# Patient Record
Sex: Female | Born: 1995 | Race: Black or African American | Hispanic: No | Marital: Single | Smoking: Never smoker
Health system: Southern US, Community
[De-identification: ages and names within clinical notes are randomized; demographics above are authoritative.]

## PROBLEM LIST (undated history)

## (undated) DIAGNOSIS — S060XAA Concussion with loss of consciousness status unknown, initial encounter: Secondary | ICD-10-CM

## (undated) DIAGNOSIS — N83209 Unspecified ovarian cyst, unspecified side: Secondary | ICD-10-CM

---

## 2017-05-26 ENCOUNTER — Ambulatory Visit (INDEPENDENT_AMBULATORY_CARE_PROVIDER_SITE_OTHER): Payer: Federal, State, Local not specified - PPO

## 2017-05-26 ENCOUNTER — Encounter: Payer: Self-pay | Admitting: Urgent Care

## 2017-05-26 ENCOUNTER — Ambulatory Visit (INDEPENDENT_AMBULATORY_CARE_PROVIDER_SITE_OTHER): Payer: Federal, State, Local not specified - PPO | Admitting: Urgent Care

## 2017-05-26 VITALS — BP 125/76 | HR 70 | Temp 98.1°F | Resp 16 | Ht 65.0 in | Wt 165.0 lb

## 2017-05-26 DIAGNOSIS — R002 Palpitations: Secondary | ICD-10-CM | POA: Diagnosis not present

## 2017-05-26 DIAGNOSIS — Z8659 Personal history of other mental and behavioral disorders: Secondary | ICD-10-CM | POA: Diagnosis not present

## 2017-05-26 DIAGNOSIS — R0789 Other chest pain: Secondary | ICD-10-CM

## 2017-05-26 NOTE — Progress Notes (Signed)
  MRN: [id number] DOB: 04/11/1995  Subjective:   Susan Barry is a 22 y.o. female presenting for 1 day history of constant, sharp, non-radiating mid-left upper sided chest pain. Has also had palpitations which have resolved. Denies fever, trauma, shob, heart racing, diaphoresis, n/v, abdominal pain, limb pain, cough. Has a history of panic attacks, patient reports that this is different. Was thought to have thyroid disease when she was in high school. Denies smoking cigarettes or drinking alcohol. Denies drug use. 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 [place]. Has good relationships at home, has a good support network. Denies depressed or anxious mood, has minimal stressors.   Susan Barry has a current medication list which includes the following prescription(s): levonorgestrel-ethinyl estradiol. Also has No Known Allergies.  Susan Barry denies past medical and surgical history. Denies family history of MI, sudden cardiac death, arrhythmias.  Objective:   Vitals: BP 125/76   Pulse 70   Temp 98.1 F (36.7 C)   Resp 16   Ht 5\' 5"  (1.651 m)   Wt 165 lb (74.8 kg)   SpO2 100%   BMI 27.46 kg/m   Physical Exam  Constitutional: She is oriented to person, place, and time. She appears well-developed and well-nourished.  HENT:  Mouth/Throat: Oropharynx is clear and moist.  Eyes: EOM are normal. Pupils are equal, round, and reactive to light. No scleral icterus.  Neck: Normal range of motion. Neck supple. No thyromegaly present.  Cardiovascular: Normal rate, regular rhythm and intact distal pulses. Exam reveals no gallop and no friction rub.  No murmur heard. Pulmonary/Chest: No respiratory distress. She has no wheezes. She has no rales.  Abdominal: Soft. Bowel sounds are normal. She exhibits no distension and no mass. There is no tenderness.  Musculoskeletal: She exhibits no edema.  Neurological: She is alert and oriented to person,  place, and time.  Skin: Skin is warm and dry. No rash noted. No erythema. No pallor.  Psychiatric: She has a normal mood and affect.   ECG interpretation - 1 PAC noted. No acute findings. Normal sinus rhythm at 75bpm. No previous ecg for comparison.  Assessment and Plan :   Atypical chest pain - Plan: EKG 12-Lead, DG Chest 2 View, Basic metabolic panel, Thyroid Panel With TSH  Palpitations  History of panic attacks  Will manage conservatively with APAP + ibuprofen for musculoskeletal type pain. Hydrate well. Labs pending. Consider referral to cardiology. If labs equivocal, consider H. Pylori testing for atypical presentation. Return-to-clinic precautions discussed, patient verbalized understanding. Radiology report pending.  Wallis BambergMario Asusena Sigley, PA-C [facility] at [facility] [phone] 05/26/2017  12:13 PM

## 2017-05-26 NOTE — Patient Instructions (Signed)
Hydrate well with at least 2 liters (1 gallon) of water daily. You may take 500mg  Tylenol with ibuprofen 400-600mg  every 6 hours for pain and inflammation.    Nonspecific Chest Pain Chest pain can be caused by many different conditions. There is a chance that your pain could be related to something serious, such as a heart attack or a blood clot in your lungs. Chest pain can also be caused by conditions that are not life-threatening. If you have chest pain, it is very important to follow up with your doctor. Follow these instructions at home: Medicines  If you were prescribed an antibiotic medicine, take it as told by your doctor. Do not stop taking the antibiotic even if you start to feel better.  Take over-the-counter and prescription medicines only as told by your doctor. Lifestyle  Do not use any products that contain nicotine or tobacco, such as cigarettes and e-cigarettes. If you need help quitting, ask your doctor.  Do not drink alcohol.  Make lifestyle changes as told by your doctor. These may include: ? Getting regular exercise. Ask your doctor for some activities that are safe for you. ? Eating a heart-healthy diet. A diet specialist (dietitian) can help you to learn healthy eating options. ? Staying at a healthy weight. ? Managing diabetes, if needed. ? Lowering your stress, as with deep breathing or spending time in nature. General instructions  Avoid any activities that make you feel chest pain.  If your chest pain is because of heartburn: ? Raise (elevate) the head of your bed about 6 inches (15 cm). You can do this by putting blocks under the bed legs at the head of the bed. ? Do not sleep with extra pillows under your head. That does not help heartburn.  Keep all follow-up visits as told by your doctor. This is important. This includes any further testing if your chest pain does not go away. Contact a doctor if:  Your chest pain does not go away.  You have a rash  with blisters on your chest.  You have a fever.  You have chills. Get help right away if:  Your chest pain is worse.  You have a cough that gets worse, or you cough up blood.  You have very bad (severe) pain in your belly (abdomen).  You are very weak.  You pass out (faint).  You have either of these for no clear reason: ? Sudden chest discomfort. ? Sudden discomfort in your arms, back, neck, or jaw.  You have shortness of breath at any time.  You suddenly start to sweat, or your skin gets clammy.  You feel sick to your stomach (nauseous).  You throw up (vomit).  You suddenly feel light-headed or dizzy.  Your heart starts to beat fast, or it feels like it is skipping beats. These symptoms may be an emergency. Do not wait to see if the symptoms will go away. Get medical help right away. Call your local emergency services (911 in the [place] Do not drive yourself to the hospital. This information is not intended to replace advice given to you by your health care provider. Make sure you discuss any questions you have with your health care provider. Document Released: 09/10/2007 Document Revised: 12/17/2015 Document Reviewed: 12/17/2015 Elsevier Interactive Patient Education  2017 [employer]

## 2017-05-27 ENCOUNTER — Encounter: Payer: Self-pay | Admitting: Radiology

## 2017-05-27 LAB — BASIC METABOLIC PANEL
BUN / CREAT RATIO: 15 (ref 9–23)
BUN: 13 mg/dL (ref 6–20)
CO2: 24 mmol/L (ref 20–29)
Calcium: 9.6 mg/dL (ref 8.7–10.2)
Chloride: 102 mmol/L (ref 96–106)
Creatinine, Ser: 0.86 mg/dL (ref 0.57–1.00)
GFR calc Af Amer: 111 mL/min/{1.73_m2} (ref >59)
GFR, EST NON AFRICAN AMERICAN: 96 mL/min/{1.73_m2} (ref >59)
Glucose: 74 mg/dL (ref 65–99)
Potassium: 4.5 mmol/L (ref 3.5–5.2)
SODIUM: 139 mmol/L (ref 134–144)

## 2017-05-27 LAB — THYROID PANEL WITH TSH
Free Thyroxine Index: 2.3 (ref 1.2–4.9)
T3 Uptake Ratio: 26 % (ref 24–39)
T4, Total: 9 ug/dL (ref 4.5–12.0)
TSH: 1.04 u[IU]/mL (ref 0.450–4.500)

## 2017-05-28 ENCOUNTER — Ambulatory Visit: Payer: Federal, State, Local not specified - PPO | Admitting: Urgent Care

## 2017-06-02 ENCOUNTER — Encounter: Payer: Self-pay | Admitting: Urgent Care

## 2018-03-05 DIAGNOSIS — G43909 Migraine, unspecified, not intractable, without status migrainosus: Secondary | ICD-10-CM | POA: Insufficient documentation

## 2019-11-16 ENCOUNTER — Encounter (HOSPITAL_COMMUNITY): Payer: Self-pay

## 2019-11-16 ENCOUNTER — Ambulatory Visit (HOSPITAL_COMMUNITY)
Admission: RE | Admit: 2019-11-16 | Discharge: 2019-11-16 | Disposition: A | Discharge status: Home / Self Care | Payer: Federal, State, Local not specified - PPO | Source: Ambulatory Visit | Attending: Urgent Care | Admitting: Urgent Care

## 2019-11-16 ENCOUNTER — Other Ambulatory Visit: Payer: Self-pay

## 2019-11-16 VITALS — BP 132/82 | HR 84 | Temp 99.5°F | Resp 16

## 2019-11-16 DIAGNOSIS — K529 Noninfective gastroenteritis and colitis, unspecified: Secondary | ICD-10-CM

## 2019-11-16 DIAGNOSIS — R197 Diarrhea, unspecified: Secondary | ICD-10-CM | POA: Diagnosis not present

## 2019-11-16 HISTORY — DX: Unspecified ovarian cyst, unspecified side: N83.209

## 2019-11-16 MED ORDER — LOPERAMIDE HCL 2 MG PO CAPS: 2.0000 mg | ORAL_CAPSULE | Freq: Two times a day (BID) | ORAL | 0 refills | Status: DC | PRN

## 2019-11-16 NOTE — ED Triage Notes (Addendum)
Patient is here today with complaints of RLQ abdominal pain  And diarrhea that began last Thursday. Patient states she is having 3-5 watery diarrhea episodes  Daily. Patient states she has not tried any OTC anti-diarrhea medications for relief. Patient states she does have a Hx Ovarian Cyst as well.

## 2019-11-16 NOTE — Discharge Instructions (Addendum)

## 2019-11-16 NOTE — ED Provider Notes (Signed)
[facility]   MRN: [id number] DOB: 1995-10-10  Subjective:   Susan Barry is a 24 y.o. female presenting for 5-day history of persistent watery diarrhea, mild intermittent right lower quadrant pain.  Has had 3-5 bowel movements per day.  Patient states that she almost exclusively eats out for every single one of her meals.  Denies fever, nausea, vomiting, bloody stools, recent antibiotic use, recent hospitalizations or travel.  Denies sore throat, cough, chest pain, shortness of breath, loss of sense of taste and smell.  Has not tried medications for relief.  Denies history of GI issues.  No current facility-administered medications for this encounter.  Current Outpatient Medications:  .  levonorgestrel-ethinyl estradiol (LEVORA 0.15/30, 28,) 0.15-30 MG-MCG tablet, TK 1 T PO QD, Disp: , Rfl:    No Known Allergies  Past Medical History:  Diagnosis Date  . Ovarian cyst      History reviewed. No pertinent surgical history.  History reviewed. No pertinent family history.  Social History   Tobacco Use  . Smoking status: Never Smoker  . Smokeless tobacco: Never Used  Vaping Use  . Vaping Use: Never used  Substance Use Topics  . Alcohol use: Yes    Comment: occ  . Drug use: Never    ROS   Objective:   Vitals: BP 132/82 (BP Location: Right Arm)   Pulse 84   Temp 99.5 F (37.5 C) (Oral)   Resp 16   LMP 10/27/2019 (Exact Date)   SpO2 99%   Physical Exam Constitutional:      General: She is not in acute distress.    Appearance: Normal appearance. She is well-developed. She is not ill-appearing, toxic-appearing or diaphoretic.  HENT:     Head: Normocephalic and atraumatic.     Nose: Nose normal.     Mouth/Throat:     Mouth: Mucous membranes are moist.     Pharynx: Oropharynx is clear.  Eyes:     General: No scleral icterus.       Right eye: No discharge.        Left eye: No discharge.     Extraocular Movements: Extraocular movements intact.      Conjunctiva/sclera: Conjunctivae normal.     Pupils: Pupils are equal, round, and reactive to light.  Cardiovascular:     Rate and Rhythm: Normal rate.  Pulmonary:     Effort: Pulmonary effort is normal.  Abdominal:     General: Bowel sounds are normal. There is no distension.     Palpations: Abdomen is soft. There is no mass.     Tenderness: There is no abdominal tenderness. There is no right CVA tenderness, left CVA tenderness, guarding or rebound.  Skin:    General: Skin is warm and dry.  Neurological:     General: No focal deficit present.     Mental Status: She is alert and oriented to person, place, and time.  Psychiatric:        Mood and Affect: Mood normal.        Behavior: Behavior normal.        Thought Content: Thought content normal.        Judgment: Judgment normal.       Assessment and Plan :   PDMP not reviewed this encounter.  1. Diarrhea, unspecified type   2. Colitis     Recommended significant dietary modifications.  Patient was agreeable to holding off on GI panel.  Use loperamide for supportive care. Counseled patient on potential for  adverse effects with medications prescribed/recommended today, ER and return-to-clinic precautions discussed, patient verbalized understanding.    Wallis Bamberg, PA-C 11/16/19 1752

## 2020-12-06 ENCOUNTER — Emergency Department (HOSPITAL_COMMUNITY): Payer: Managed Care, Other (non HMO)

## 2020-12-06 ENCOUNTER — Encounter (HOSPITAL_COMMUNITY): Payer: Self-pay | Admitting: Emergency Medicine

## 2020-12-06 ENCOUNTER — Other Ambulatory Visit: Payer: Self-pay

## 2020-12-06 ENCOUNTER — Emergency Department (HOSPITAL_COMMUNITY)
Admission: EM | Admit: 2020-12-06 | Discharge: 2020-12-06 | Disposition: A | Discharge status: Home / Self Care | Payer: Managed Care, Other (non HMO) | Attending: Emergency Medicine | Admitting: Emergency Medicine

## 2020-12-06 DIAGNOSIS — R000 Tachycardia, unspecified: Secondary | ICD-10-CM | POA: Insufficient documentation

## 2020-12-06 DIAGNOSIS — N9489 Other specified conditions associated with female genital organs and menstrual cycle: Secondary | ICD-10-CM | POA: Insufficient documentation

## 2020-12-06 DIAGNOSIS — R002 Palpitations: Secondary | ICD-10-CM | POA: Insufficient documentation

## 2020-12-06 LAB — BASIC METABOLIC PANEL
Anion gap: 7 (ref 5–15)
BUN: 15 mg/dL (ref 6–20)
CO2: 24 mmol/L (ref 22–32)
Calcium: 9.5 mg/dL (ref 8.9–10.3)
Chloride: 105 mmol/L (ref 98–111)
Creatinine, Ser: 0.89 mg/dL (ref 0.44–1.00)
GFR, Estimated: >60 mL/min (ref >60)
Glucose, Bld: 96 mg/dL (ref 70–99)
Potassium: 3.9 mmol/L (ref 3.5–5.1)
Sodium: 136 mmol/L (ref 135–145)

## 2020-12-06 LAB — CBC
HCT: 37.4 % (ref 36.0–46.0)
Hemoglobin: 12.6 g/dL (ref 12.0–15.0)
MCH: 30.1 pg (ref 26.0–34.0)
MCHC: 33.7 g/dL (ref 30.0–36.0)
MCV: 89.3 fL (ref 80.0–100.0)
Platelets: 332 10*3/uL (ref 150–400)
RBC: 4.19 MIL/uL (ref 3.87–5.11)
RDW: 12 % (ref 11.5–15.5)
WBC: 9.8 10*3/uL (ref 4.0–10.5)
nRBC: 0 % (ref 0.0–0.2)

## 2020-12-06 LAB — D-DIMER, QUANTITATIVE: D-Dimer, Quant: 0.65 ug/mL-FEU — ABNORMAL HIGH (ref 0.00–0.50)

## 2020-12-06 LAB — TROPONIN I (HIGH SENSITIVITY)
Troponin I (High Sensitivity): 3 ng/L (ref <18)
Troponin I (High Sensitivity): 3 ng/L (ref <18)

## 2020-12-06 LAB — I-STAT BETA HCG BLOOD, ED (MC, WL, AP ONLY): I-stat hCG, quantitative: <5 m[IU]/mL (ref <5)

## 2020-12-06 MED ORDER — IOHEXOL 350 MG/ML SOLN
80.0000 mL | Freq: Once | INTRAVENOUS | Status: AC | PRN
  Administered 2020-12-06: 80 mL via INTRAVENOUS

## 2020-12-06 NOTE — ED Provider Notes (Signed)
EKG reviewed. Sinus tachycardia rate 109. Normal intervals, normal axis, normal ST-T waves   Linwood Dibbles, MD 12/06/20 1814

## 2020-12-06 NOTE — ED Triage Notes (Signed)
Pt states that she has been taking muscle relaxers for sciatica pain and stopped taking it today. States that she started having irregular heartrate today at work. Alert and oriented.

## 2020-12-06 NOTE — ED Provider Notes (Signed)
[facility]-EMERGENCY DEPT Provider Note   CSN: [id number] Arrival date & time: 12/06/20  1735     History Chief Complaint  Patient presents with   Irregular Heart Beat    Susan Barry is a 25 y.o. female.  HPI   Pt started having episodes of her heart fluctuating today.  At times it was very fast and then seemed slow.  No chest pain or shortness of breath.   No leg swelling.  No fevers or chills.  No syncope.  Pt is being treated for sciatica.  She did not take her meds today.  Past Medical History:  Diagnosis Date   Ovarian cyst     There are no problems to display for this patient.   History reviewed. No pertinent surgical history.   OB History   No obstetric history on file.     History reviewed. No pertinent family history.  Social History   Tobacco Use   Smoking status: Never   Smokeless tobacco: Never  Vaping Use   Vaping Use: Never used  Substance Use Topics   Alcohol use: Yes    Comment: occ   Drug use: Never    Home Medications Prior to Admission medications   Medication Sig Start Date End Date Taking? Authorizing Provider  levonorgestrel-ethinyl estradiol (LEVORA 0.15/30, 28,) 0.15-30 MG-MCG tablet TK 1 T PO QD 12/05/14   [provider]  loperamide (IMODIUM) 2 MG capsule Take 1 capsule (2 mg total) by mouth 2 (two) times daily as needed for diarrhea or loose stools. 11/16/19   Wallis Bamberg, PA-C    Allergies    Patient has no known allergies.  Review of Systems   Review of Systems  All other systems reviewed and are negative.  Physical Exam Updated Vital Signs BP 134/76   Pulse 93   Temp 98.2 F (36.8 C)   Resp 16   Ht 1.651 m (5\' 5" )   Wt 81.6 kg   LMP 11/22/2020 (Exact Date)   SpO2 99%   BMI 29.95 kg/m   Physical Exam Vitals and nursing note reviewed.  Constitutional:      General: She is not in acute distress.    Appearance: She is well-developed.  HENT:     Head: Normocephalic and  atraumatic.     Right Ear: External ear normal.     Left Ear: External ear normal.  Eyes:     General: No scleral icterus.       Right eye: No discharge.        Left eye: No discharge.     Conjunctiva/sclera: Conjunctivae normal.  Neck:     Trachea: No tracheal deviation.  Cardiovascular:     Rate and Rhythm: Normal rate and regular rhythm.  Pulmonary:     Effort: Pulmonary effort is normal. No respiratory distress.     Breath sounds: Normal breath sounds. No stridor. No wheezing or rales.  Abdominal:     General: Bowel sounds are normal. There is no distension.     Palpations: Abdomen is soft.     Tenderness: There is no abdominal tenderness. There is no guarding or rebound.  Musculoskeletal:        General: No tenderness or deformity.     Cervical back: Neck supple.  Skin:    General: Skin is warm and dry.     Findings: No rash.  Neurological:     General: No focal deficit present.     Mental Status: She is  alert.     Cranial Nerves: No cranial nerve deficit (no facial droop, extraocular movements intact, no slurred speech).     Sensory: No sensory deficit.     Motor: No abnormal muscle tone or seizure activity.     Coordination: Coordination normal.  Psychiatric:        Mood and Affect: Mood normal.    ED Results / Procedures / Treatments   Labs (all labs ordered are listed, but only abnormal results are displayed) Labs Reviewed  D-DIMER, QUANTITATIVE - Abnormal; Notable for the following components:      Result Value   D-Dimer, Quant 0.65 (*)    All other components within normal limits  BASIC METABOLIC PANEL  CBC  I-STAT BETA HCG BLOOD, ED ([facility], [facility], [facility] ONLY)  TROPONIN I (HIGH SENSITIVITY)  TROPONIN I (HIGH SENSITIVITY)    EKG None  Radiology DG Chest 2 View  Result Date: 12/06/2020 CLINICAL DATA:  irregular heart rate EXAM: CHEST - 2 VIEW COMPARISON:  05/26/2017 FINDINGS: The heart size and mediastinal contours are within normal limits. No focal airspace  consolidation, pleural effusion, or pneumothorax. The visualized skeletal structures are unremarkable. IMPRESSION: Normal chest x-rays. Electronically Signed   By: Duanne Guess D.O.   On: 12/06/2020 19:20   CT Angio Chest PE W and/or Wo Contrast  Result Date: 12/06/2020 CLINICAL DATA:  Tachycardia EXAM: CT ANGIOGRAPHY CHEST WITH CONTRAST TECHNIQUE: Multidetector CT imaging of the chest was performed using the standard protocol during bolus administration of intravenous contrast. Multiplanar CT image reconstructions and MIPs were obtained to evaluate the vascular anatomy. CONTRAST:  52mL OMNIPAQUE IOHEXOL 350 MG/ML SOLN COMPARISON:  None. FINDINGS: Cardiovascular: Satisfactory opacification of the bilateral pulmonary arteries to the lobar level. Evaluation is mildly constrained by respiratory motion. No evidence of pulmonary embolism. Although not tailored for evaluation of the thoracic aorta, there is no evidence of thoracic aortic aneurysm or dissection. The heart is normal in size.  No pericardial effusion. Mediastinum/Nodes: No suspicious mediastinal lymphadenopathy. Visualized thyroid is unremarkable. Lungs/Pleura: Lungs are essentially clear. No suspicious pulmonary nodules, noting motion degradation. No focal consolidation. No pleural effusion or pneumothorax. Upper Abdomen: Visualized upper abdomen is unremarkable. Musculoskeletal: Visualized osseous structures are within normal limits. Review of the MIP images confirms the above findings. IMPRESSION: No evidence of pulmonary embolism. Negative CT chest. Electronically Signed   By: Charline Bills M.D.   On: 12/06/2020 21:32    Procedures Procedures   Medications Ordered in ED Medications  iohexol (OMNIPAQUE) 350 MG/ML injection 80 mL (80 mLs Intravenous Contrast Given 12/06/20 2103)    ED Course  I have reviewed the triage vital signs and the nursing notes.  Pertinent labs & imaging results that were available during my care of the  patient were reviewed by me and considered in my medical decision making (see chart for details).  Clinical Course as of 12/06/20 2231  Thu Dec 06, 2020  2200 CT angio negative [JK]    Clinical Course User Index [JK] Linwood Dibbles, MD   MDM Rules/Calculators/A&P                           Patient presented to the ED for evaluation of palpitations.  No signs of anemia or severe dehydration.  X-ray without pneumonia.  EKG shows sinus rhythm.  Heart rate was down in the 90s at the bedside.  CT scan was performed considering her elevated D-dimer.  No signs of pulmonary embolism.  Possible the patient had an episode of SVT.  At this point she has a normal sinus rhythm.  Stable for discharge.  Recommend outpatient cardiac evaluation.  May benefit from Holter monitoring Final Clinical Impression(s) / ED Diagnoses Final diagnoses:  Palpitations    Rx / DC Orders ED Discharge Orders     None        Linwood Dibbles, MD 12/06/20 2233

## 2020-12-06 NOTE — Discharge Instructions (Addendum)
Your ED evaluation this evening was reassuring.  Follow-up with the cardiologist as we discussed for further evaluation of the irregular heart rate symptoms.  Return to the ED as needed for worsening symptoms

## 2020-12-06 NOTE — ED Provider Notes (Signed)
Emergency Medicine Provider Triage Evaluation Note  Susan Barry , a 25 y.o. female  was evaluated in triage.  Pt complains of heart racing.  Pt reports pain in left buttock and down leg.  Pt reports flying 3 days ago.  Pt on birth control   Review of Systems  Positive: Heart racing Negative: No fever   Physical Exam  BP (!) 150/84 (BP Location: Left Arm)   Pulse (!) 103   Temp 98.2 F (36.8 C)   Resp 16   Ht 5\' 5"  (1.651 m)   Wt 81.6 kg   LMP 11/22/2020 (Exact Date)   SpO2 100%   BMI 29.95 kg/m  Gen:   Awake, no distress   Resp:  Normal effort  MSK:   Moves extremities without difficulty  Other:    Medical Decision Making  Medically screening exam initiated at 6:27 PM.  Appropriate orders placed.  Susan Barry was informed that the remainder of the evaluation will be completed by another provider, this initial triage assessment does not replace that evaluation, and the importance of remaining in the ED until their evaluation is complete.     Diamond Nickel, PA-C 12/06/20 02/05/21, MD 12/07/20 [phone]

## 2020-12-12 ENCOUNTER — Encounter: Payer: Self-pay | Admitting: Internal Medicine

## 2020-12-12 ENCOUNTER — Other Ambulatory Visit: Payer: Self-pay

## 2020-12-12 ENCOUNTER — Ambulatory Visit (INDEPENDENT_AMBULATORY_CARE_PROVIDER_SITE_OTHER): Payer: Managed Care, Other (non HMO) | Admitting: Internal Medicine

## 2020-12-12 VITALS — BP 120/80 | HR 72 | Ht 65.0 in | Wt 182.2 lb

## 2020-12-12 DIAGNOSIS — R002 Palpitations: Secondary | ICD-10-CM | POA: Diagnosis not present

## 2020-12-12 NOTE — Patient Instructions (Signed)
Medication Instructions:  No Changes In Medications at this time.  *If you need a refill on your cardiac medications before your next appointment, please call your pharmacy*  Follow-Up: At [facility], you and your health needs are our priority.  As part of our continuing mission to provide you with exceptional heart care, we have created designated Provider Care Teams.  These Care Teams include your primary Cardiologist (physician) and Advanced Practice Providers (APPs -  Physician Assistants and Nurse Practitioners) who all work together to provide you with the care you need, when you need it.  Your next appointment:   AS NEEDED   The format for your next appointment:   In Person  Provider:   Weston Brass, MD  PLEASE CALL us IF YOU WOULD LIKE TO SCHEDULE ECHO OR HEART MONITOR- [phone]

## 2020-12-12 NOTE — Progress Notes (Signed)
Cardiology Office Note:    Date:  12/12/2020   ID:  Diamond Nickel, DOB [birth year], MRN [id number]  PCP:  Pcp, No  Cardiologist:  None  Electrophysiologist:  None   Referring MD: No ref. provider found   Chief Complaint/Reason for Referral: palpitations  History of Present Illness:    Susan Barry is a 25 y.o. female with no significant past medication history. Here for episode of palpitations.  First episode of heart racing, tingling in left hand. 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 Was meeting with a client and had episode of heart racing which is very unusual for her. Had recently been on a muscle relaxer for sciatica injury. Started at 2:10pm - drank water and got better, but lasted for hours off and on. We reivewed apple watch heart rate trend for 12/06/20, 114 bpm peak. No recurrence since. She exercises daily (hasn't since this episode and because of sciatica).  Only on birth control pill, for contraception. No known issues when she was in utero and no major childhood illnesses.  Caffeine: rare coffee, occasional soda Alcohol: no increased use Water intake: well hydrated.  Snoring: does snore, not checked for Osa TSH: last TSH 2019 was normal.  Herbal supplements/diet products: none Syncope/presyncope: none.   Fhx: GF - with heart disease, no family hx of congenital heart disease.   Past Medical History:  Diagnosis Date   Ovarian cyst     No past surgical history on file.  Current Medications: Current Meds  Medication Sig   ESTARYLLA 0.25-35 MG-MCG tablet Take 1 tablet by mouth daily.   [DISCONTINUED] levonorgestrel-ethinyl estradiol (NORDETTE) 0.15-30 MG-MCG tablet TK 1 T PO QD     Allergies:   Patient has no known allergies.   Social History   Tobacco Use   Smoking status: Never   Smokeless tobacco: Never  Vaping Use   Vaping Use: Never used  Substance Use Topics   Alcohol use: Yes    Comment: occ   Drug use:  Never     Family History: The patient's family history is not on file.  ROS:   Please see the history of present illness.    All other systems reviewed and are negative.  EKGs/Labs/Other Studies Reviewed:    The following studies were reviewed today:  EKG:  NSR  Imaging studies that I have independently reviewed today: CT angio chest PE 12/06/20. Likely normal coronary origins and course though RCA origin slightly difficult to discern with expected motion of nongated study.  Recent Labs: 12/06/2020: BUN 15; Creatinine, Ser 0.89; Hemoglobin 12.6; Platelets 332; Potassium 3.9; Sodium 136  Recent Lipid Panel No results found for: CHOL, TRIG, HDL, CHOLHDL, VLDL, LDLCALC, LDLDIRECT  Physical Exam:    VS:  BP 120/80 (BP Location: Left Arm, Patient Position: Sitting, Cuff Size: Normal)   Pulse 72   Ht 5\' 5"  (1.651 m)   Wt 182 lb 3.2 oz (82.6 kg)   LMP 11/22/2020 (Exact Date)   SpO2 100%   BMI 30.32 kg/m     Wt Readings from Last 5 Encounters:  12/12/20 182 lb 3.2 oz (82.6 kg)  12/06/20 180 lb (81.6 kg)  05/26/17 165 lb (74.8 kg)    Constitutional: No acute distress Eyes: sclera non-icteric, normal conjunctiva and lids ENMT: normal dentition, moist mucous membranes Cardiovascular: regular rhythm, normal rate, no murmurs. S1 and S2 normal. Radial pulses normal bilaterally. No jugular venous distention.  Respiratory: clear to auscultation bilaterally GI : normal  bowel sounds, soft and nontender. No distention.   MSK: extremities warm, well perfused. No edema.  NEURO: grossly nonfocal exam, moves all extremities. PSYCH: alert and oriented x 3, normal mood and affect.   ASSESSMENT:    1. Palpitations    PLAN:    Palpitations - Plan: EKG 12-Lead  Isolated episode of palpitations with peak HR of 114. We participated in shared decision making and determined that if episodes recur or she has red flag symptoms we will discuss echocardiogram and 2 week cardiac monitor. If no  recurrence and she continues to feel well, can observe for now.    Weston Brass, MD, [facility] [place]  [facility]   Shared Decision Making/Informed Consent:       Medication Adjustments/Labs and Tests Ordered: Current medicines are reviewed at length with the patient today.  Concerns regarding medicines are outlined above.   Orders Placed This Encounter  Procedures   EKG 12-Lead    No orders of the defined types were placed in this encounter.   Patient Instructions  Medication Instructions:  No Changes In Medications at this time.  *If you need a refill on your cardiac medications before your next appointment, please call your pharmacy*  Follow-Up: At [facility], you and your health needs are our priority.  As part of our continuing mission to provide you with exceptional heart care, we have created designated Provider Care Teams.  These Care Teams include your primary Cardiologist (physician) and Advanced Practice Providers (APPs -  Physician Assistants and Nurse Practitioners) who all work together to provide you with the care you need, when you need it.  Your next appointment:   AS NEEDED   The format for your next appointment:   In Person  Provider:   Weston Brass, MD  PLEASE CALL us IF YOU WOULD LIKE TO SCHEDULE ECHO OR HEART MONITOR- [phone]

## 2021-01-06 ENCOUNTER — Other Ambulatory Visit: Payer: Self-pay

## 2021-01-06 ENCOUNTER — Encounter (HOSPITAL_COMMUNITY): Payer: Self-pay | Admitting: Emergency Medicine

## 2021-01-06 ENCOUNTER — Emergency Department (HOSPITAL_COMMUNITY)
Admission: EM | Admit: 2021-01-06 | Discharge: 2021-01-06 | Disposition: A | Discharge status: Home / Self Care | Payer: Managed Care, Other (non HMO) | Attending: Emergency Medicine | Admitting: Emergency Medicine

## 2021-01-06 DIAGNOSIS — R079 Chest pain, unspecified: Secondary | ICD-10-CM | POA: Insufficient documentation

## 2021-01-06 DIAGNOSIS — R002 Palpitations: Secondary | ICD-10-CM | POA: Diagnosis not present

## 2021-01-06 LAB — I-STAT CHEM 8, ED
BUN: 11 mg/dL (ref 6–20)
Calcium, Ion: 1.24 mmol/L (ref 1.15–1.40)
Chloride: 106 mmol/L (ref 98–111)
Creatinine, Ser: 0.9 mg/dL (ref 0.44–1.00)
Glucose, Bld: 116 mg/dL — ABNORMAL HIGH (ref 70–99)
HCT: 36 % (ref 36.0–46.0)
Hemoglobin: 12.2 g/dL (ref 12.0–15.0)
Potassium: 4.1 mmol/L (ref 3.5–5.1)
Sodium: 138 mmol/L (ref 135–145)
TCO2: 24 mmol/L (ref 22–32)

## 2021-01-06 LAB — I-STAT BETA HCG BLOOD, ED (MC, WL, AP ONLY): I-stat hCG, quantitative: <5 m[IU]/mL (ref <5)

## 2021-01-06 MED ORDER — ALUM & MAG HYDROXIDE-SIMETH 200-200-20 MG/5ML PO SUSP
30.0000 mL | Freq: Once | ORAL | Status: AC
  Administered 2021-01-06: 30 mL via ORAL
  Filled 2021-01-06: qty 30

## 2021-01-06 MED ORDER — LIDOCAINE VISCOUS HCL 2 % MT SOLN
15.0000 mL | Freq: Once | OROMUCOSAL | Status: AC
  Administered 2021-01-06: 15 mL via ORAL
  Filled 2021-01-06: qty 15

## 2021-01-06 NOTE — Discharge Instructions (Addendum)
You were evaluated in the Emergency Department and after careful evaluation, we did not find any emergent condition requiring admission or further testing in the hospital.   Please avoid foods that could trigger reflux including spicy or fried foods.  You may take over-the-counter Prilosec as well.  Please follow-up with your primary care doctor and/or cardiology if your symptoms persist.  Please return to the Emergency Department if you experience any worsening of your condition.  Thank you for allowing [place] to be a part of your care.

## 2021-01-06 NOTE — ED Provider Notes (Signed)
[facility]-EMERGENCY DEPT Provider Note   CSN: [id number] Arrival date & time: 01/06/21  0123     History Chief Complaint  Patient presents with   Chest Pain    Susan Barry is a 25 y.o. female.  HPI 25 year old female with history of ovarian cyst presents to the ER with complaints of chest burning and palpitations.  She states that she ate some spicy [place] food overnight and started to feel burning in her chest.  She took some Tums but this did not relieve her symptoms and this caused her to be worried prompting her to come to the ER.  She was seen here approximately a month ago with similar complaints, had a negative PE study and a cardiac work-up done.  She has also since followed up with cardiology and they have cleared her as well.  She denies any dizziness, cough, fevers, chills, syncope    Past Medical History:  Diagnosis Date   Ovarian cyst     There are no problems to display for this patient.   History reviewed. No pertinent surgical history.   OB History   No obstetric history on file.     History reviewed. No pertinent family history.  Social History   Tobacco Use   Smoking status: Never   Smokeless tobacco: Never  Vaping Use   Vaping Use: Never used  Substance Use Topics   Alcohol use: Yes    Comment: occ   Drug use: Never    Home Medications Prior to Admission medications   Medication Sig Start Date End Date Taking? Authorizing Provider  cyclobenzaprine (FLEXERIL) 5 MG tablet Take 5 mg by mouth 3 (three) times daily as needed. Patient not taking: Reported on 12/12/2020 12/03/20   [provider]  ESTARYLLA 0.25-35 MG-MCG tablet Take 1 tablet by mouth daily. 09/24/20   [provider]  ibuprofen (ADVIL) 800 MG tablet Take by mouth. Patient not taking: Reported on 12/12/2020 12/03/20   [provider]  loperamide (IMODIUM) 2 MG capsule Take 1 capsule (2 mg total) by mouth 2 (two) times daily as needed  for diarrhea or loose stools. Patient not taking: Reported on 12/12/2020 11/16/19   Wallis Bamberg, PA-C  predniSONE (DELTASONE) 20 MG tablet Take 20 mg by mouth 2 (two) times daily. 08/29/20   [provider]    Allergies    Patient has no known allergies.  Review of Systems   Review of Systems Ten systems reviewed and are negative for acute change, except as noted in the HPI.   Physical Exam Updated Vital Signs BP (!) 147/75 (BP Location: Left Arm)   Pulse 98   Temp 97.9 F (36.6 C) (Oral)   Resp 20   Ht 5\' 5"  (1.651 m)   Wt 81.6 kg   SpO2 100%   BMI 29.95 kg/m   Physical Exam Vitals and nursing note reviewed.  Constitutional:      General: She is not in acute distress.    Appearance: She is well-developed. She is not diaphoretic.  HENT:     Head: Normocephalic and atraumatic.  Eyes:     Conjunctiva/sclera: Conjunctivae normal.  Cardiovascular:     Rate and Rhythm: Normal rate and regular rhythm.     Pulses:          Radial pulses are 2+ on the right side and 2+ on the left side.     Heart sounds: Normal heart sounds. No murmur heard. Pulmonary:  Effort: Pulmonary effort is normal. No respiratory distress.     Breath sounds: Normal breath sounds. No decreased breath sounds.  Chest:     Chest wall: No tenderness.  Abdominal:     Palpations: Abdomen is soft.     Tenderness: There is no abdominal tenderness.  Musculoskeletal:     Cervical back: Neck supple.  Skin:    General: Skin is warm and dry.  Neurological:     General: No focal deficit present.     Mental Status: She is alert.  Psychiatric:        Mood and Affect: Mood normal.        Behavior: Behavior normal.    ED Results / Procedures / Treatments   Labs (all labs ordered are listed, but only abnormal results are displayed) Labs Reviewed  I-STAT CHEM 8, ED - Abnormal; Notable for the following components:      Result Value   Glucose, Bld 116 (*)    All other components within normal limits   I-STAT BETA HCG BLOOD, ED ([facility], [facility], [facility] ONLY)    EKG EKG Interpretation  Date/Time:  Sunday January 06 2021 02:23:26 EDT Ventricular Rate:  73 PR Interval:  176 QRS Duration: 70 QT Interval:  375 QTC Calculation: 414 R Axis:   57 Text Interpretation: Sinus rhythm Confirmed by Nicanor Alcon, April ([id number]) on 01/06/2021 5:02:15 AM  Radiology No results found.  Procedures Procedures   Medications Ordered in ED Medications  alum & mag hydroxide-simeth (MAALOX/MYLANTA) 200-200-20 MG/5ML suspension 30 mL (has no administration in time range)    And  lidocaine (XYLOCAINE) 2 % viscous mouth solution 15 mL (has no administration in time range)    ED Course  I have reviewed the triage vital signs and the nursing notes.  Pertinent labs & imaging results that were available during my care of the patient were reviewed by me and considered in my medical decision making (see chart for details).    MDM Rules/Calculators/A&P                           Patient here with chest pain, on arrival, well-appearing, resting comfortably in the ER bed.  EKG normal sinus rhythm with no acute changes.  I-STAT Chem-8 ordered in triage without any electrolyte abnormalities.  On evaluation, patient states that her symptoms have improved and her chest pain is not constant.  I do think this is likely GERD related as she states that she ate spicy food tonight.  Patient was recently seen by cardiology and cleared.  Patient was given GI cocktail, notes provement in her symptoms.  I encouraged PCP follow-up or cardiology follow-up if her symptoms persist.  Low suspicion for ACS, PE (Wells Score negative) , pneumonia, or any other acute life-threatening causes of chest pain.  She voices understanding and is agreeable.  Stable for discharge   Final Clinical Impression(s) / ED Diagnoses Final diagnoses:  Chest pain, unspecified type    Rx / DC Orders ED Discharge Orders     None        Leone Brand 01/06/21 0543    Palumbo, April, MD 01/06/21 [phone]

## 2021-01-06 NOTE — ED Triage Notes (Signed)
Patient arrives complaining of chest pain that worsens with movement. Patient states the pain has been intermittent. Seen x1 month ago for similar symptoms, did follow up with cardiology who cleared her at the time. Patient states tonight she began having sharp left chest pain, no other associated symptoms.

## 2021-01-06 NOTE — ED Provider Notes (Signed)
Emergency Medicine Provider Triage Evaluation Note  Susan Barry , a 25 y.o. female  was evaluated in triage.  Pt complains of chest pain and palpitations.  Patient states that she ate dinner and darted to develop some chest pain.  She took some Pepcid but this did not significantly alleviate her pain and so she became anxious about it.  She was seen here 1 month ago with similar symptoms, did follow-up with cardiology who cleared her at the time.  She denies any shortness of breath, diaphoresis, syncope.  Review of Systems  Positive: As above Negative: As above  Physical Exam  BP (!) 147/75 (BP Location: Left Arm)   Pulse 98   Temp 97.9 F (36.6 C) (Oral)   Resp 20   Ht 5\' 5"  (1.651 m)   Wt 81.6 kg   SpO2 100%   BMI 29.95 kg/m  Gen:   Awake, no distress   Resp:  Normal effort  MSK:   Moves extremities without difficulty  Other:  RRR  Medical Decision Making  Medically screening exam initiated at 2:08 AM.  Appropriate orders placed.  Susan Barry was informed that the remainder of the evaluation will be completed by another provider, this initial triage assessment does not replace that evaluation, and the importance of remaining in the ED until their evaluation is complete.     Diamond Nickel 01/06/21 03/08/21, April, MD 01/06/21 [phone]

## 2021-01-08 ENCOUNTER — Telehealth: Payer: Self-pay | Admitting: Internal Medicine

## 2021-01-08 NOTE — Telephone Encounter (Signed)
Late entry -- Spoke with patient earlier today  In addition to information give to [place], she started Nexium 20 mg daily for 14 days 2 days ago Still belching Denies fast heart rate or palpitations   Patient wanting to proceed with Echo   Will forward to Dr Jacques Navy for review

## 2021-01-08 NOTE — Telephone Encounter (Signed)
Left a message for the patient to call back.  

## 2021-01-08 NOTE — Telephone Encounter (Signed)
Spoke to the patient about her concerns for chest pain. She stated that she went to the ED on Sunday for constant chest pain. She was given a GI cocktail which she did state helped relieve the pain.   As of this morning, the pain has come back again. She takes a heartburn medication daily but could not remember the name of it. She stated that the pain does not radiate. She denies shortness of breath. She is able to work out daily without any complications.   She stated that she wanted to give Dr. Jacques Navy an update.

## 2021-01-08 NOTE — Telephone Encounter (Signed)
Pt c/o of Chest Pain: STAT if CP now or developed within 24 hours  1. Are you having CP right now? yes  2. Are you experiencing any other symptoms (ex. SOB, nausea, vomiting, sweating)? no  3. How long have you been experiencing CP? Since Sunday  4. Is your CP continuous or coming and going? continuous  5. Have you taken Nitroglycerin? yes ?   Patient states she has been having chest pain since Sunday and went to the ED for it. She states she is still having it now and has taken nitroglycerin.

## 2021-01-08 NOTE — Telephone Encounter (Signed)
5:12 PM Sandi Mariscal, RN attempted to contact Diamond Nickel (Left Message)   Parke Poisson, MD to Sandi Mariscal, RN  Baird Cancer, RN     4:06 PM  Please have her make a follow up with me or APP if concerned, otherwise sounds like she ruled out while in the ED.  GA

## 2021-01-09 NOTE — Telephone Encounter (Signed)
Appointment made for 10/11 with APP

## 2021-01-10 ENCOUNTER — Encounter (HOSPITAL_COMMUNITY): Payer: Self-pay

## 2021-01-10 ENCOUNTER — Other Ambulatory Visit: Payer: Self-pay

## 2021-01-10 ENCOUNTER — Ambulatory Visit (HOSPITAL_COMMUNITY)
Admission: RE | Admit: 2021-01-10 | Discharge: 2021-01-10 | Disposition: A | Discharge status: Home / Self Care | Payer: Managed Care, Other (non HMO) | Source: Ambulatory Visit

## 2021-01-10 VITALS — BP 142/84 | HR 87 | Temp 98.4°F

## 2021-01-10 DIAGNOSIS — K219 Gastro-esophageal reflux disease without esophagitis: Secondary | ICD-10-CM

## 2021-01-10 DIAGNOSIS — R0789 Other chest pain: Secondary | ICD-10-CM

## 2021-01-10 MED ORDER — LIDOCAINE VISCOUS HCL 2 % MT SOLN: 30.0000 mL | Freq: Two times a day (BID) | OROMUCOSAL | 0 refills | Status: DC | PRN

## 2021-01-10 NOTE — ED Provider Notes (Signed)
[facility]    CSN: [id number] Arrival date & time: 01/10/21  1604      History   Chief Complaint Chief Complaint  Patient presents with   Chest Pain    HPI Susan Barry is a 25 y.o. female.   Patient here for evaluation of chest pain.  Reports being seen and evaluated in the ED on Sunday for same and has follow-up appointment with cardiology on Tuesday.  Reports pain is constant and aching and burning.  Reports being given a GI cocktail in the emergency department which did help with pain.  Reports that she is taking an OTC acid reflux medication with minimal symptom relief.  Also reports frequently belching.  Denies any trauma, injury, or other precipitating event.  Denies any specific alleviating or aggravating factors.  Denies any fevers, shortness of breath, N/V/D, numbness, tingling, weakness, abdominal pain, or headaches.    The history is provided by the patient.  Chest Pain Associated symptoms: no cough, no fever, no palpitations and no shortness of breath    Past Medical History:  Diagnosis Date   Ovarian cyst     There are no problems to display for this patient.   History reviewed. No pertinent surgical history.  OB History   No obstetric history on file.      Home Medications    Prior to Admission medications   Medication Sig Start Date End Date Taking? Authorizing Provider  esomeprazole (NEXIUM) 20 MG capsule Take 20 mg by mouth daily at 12 noon.   Yes [provider]  ESTARYLLA 0.25-35 MG-MCG tablet Take 1 tablet by mouth daily. 09/24/20  Yes [provider]  GI Cocktail (alum & mag hydroxide, lidocaine, dicyclomine) oral mixture Take 30 mLs by mouth 2 (two) times daily as needed. 01/10/21  Yes Ivette Loyal, NP  cyclobenzaprine (FLEXERIL) 5 MG tablet Take 5 mg by mouth 3 (three) times daily as needed. Patient not taking: Reported on 12/12/2020 12/03/20   [provider]  ibuprofen (ADVIL) 800 MG tablet Take by  mouth. Patient not taking: Reported on 12/12/2020 12/03/20   [provider]  loperamide (IMODIUM) 2 MG capsule Take 1 capsule (2 mg total) by mouth 2 (two) times daily as needed for diarrhea or loose stools. Patient not taking: Reported on 12/12/2020 11/16/19   Wallis Bamberg, PA-C  predniSONE (DELTASONE) 20 MG tablet Take 20 mg by mouth 2 (two) times daily. 08/29/20   [provider]    Family History History reviewed. No pertinent family history.  Social History Social History   Tobacco Use   Smoking status: Never   Smokeless tobacco: Never  Vaping Use   Vaping Use: Never used  Substance Use Topics   Alcohol use: Yes    Comment: occ   Drug use: Never     Allergies   Patient has no known allergies.   Review of Systems Review of Systems  Constitutional:  Negative for fever.  Respiratory:  Negative for cough and shortness of breath.   Cardiovascular:  Positive for chest pain. Negative for palpitations and leg swelling.  All other systems reviewed and are negative.   Physical Exam Triage Vital Signs ED Triage Vitals  Enc Vitals Group     BP 01/10/21 1615 (!) 142/84     Pulse Rate 01/10/21 1615 87     Resp --      Temp 01/10/21 1615 98.4 F (36.9 C)     Temp Source 01/10/21 1615 Oral  SpO2 01/10/21 1615 98 %     Weight --      Height --      Head Circumference --      Peak Flow --      Pain Score 01/10/21 1616 6     Pain Loc --      Pain Edu? --      Excl. in GC? --    No data found.  Updated Vital Signs BP (!) 142/84 (BP Location: Left Arm)   Pulse 87   Temp 98.4 F (36.9 C) (Oral)   SpO2 98%   Visual Acuity Right Eye Distance:   Left Eye Distance:   Bilateral Distance:    Right Eye Near:   Left Eye Near:    Bilateral Near:     Physical Exam Vitals and nursing note reviewed.  Constitutional:      General: She is not in acute distress.    Appearance: Normal appearance. She is not ill-appearing, toxic-appearing or diaphoretic.   HENT:     Head: Normocephalic and atraumatic.  Eyes:     Conjunctiva/sclera: Conjunctivae normal.  Neck:     Vascular: No JVD.  Cardiovascular:     Rate and Rhythm: Normal rate and regular rhythm.     Pulses: Normal pulses.     Heart sounds: Normal heart sounds.  Pulmonary:     Effort: Pulmonary effort is normal.     Breath sounds: Normal breath sounds.  Chest:     Chest wall: No mass, deformity, tenderness or edema.  Abdominal:     General: Abdomen is flat.  Musculoskeletal:        General: Normal range of motion.     Cervical back: Normal range of motion and neck supple.  Skin:    General: Skin is warm and dry.  Neurological:     General: No focal deficit present.     Mental Status: She is alert and oriented to person, place, and time.  Psychiatric:        Mood and Affect: Mood normal.     UC Treatments / Results  Labs (all labs ordered are listed, but only abnormal results are displayed) Labs Reviewed - No data to display  EKG   Radiology No results found.  Procedures Procedures (including critical care time)  Medications Ordered in UC Medications - No data to display  Initial Impression / Assessment and Plan / UC Course  I have reviewed the triage vital signs and the nursing notes.  Pertinent labs & imaging results that were available during my care of the patient were reviewed by me and considered in my medical decision making (see chart for details).    Assessment negative for red flags or concerns.  Atypical chest pain possibly due to GERD.  EKG and lab work in ED was within normal limits.  Patient declined repeat EKG at this time.  With epigastric pain and belching this is likely GERD so we will prescribe a GI cocktail as needed.  Encourage fluids.  Instructed patient to not lay down immediately after eating and try to avoid spicy, fried, or high acidity foods.  Follow-up with cardiology as scheduled.  Strict ED follow-up for any worsening  symptoms. Final Clinical Impressions(s) / UC Diagnoses   Final diagnoses:  Atypical chest pain  Gastroesophageal reflux disease without esophagitis     Discharge Instructions      You can take the GI Cocktail twice a day as needed for pain and acid reflux.  Make sure you are drinking plenty of fluids, especially water.  Do not lay down immediately after eating.  Try to avoid fried, spicy, or high acidity foods.   Return or go to the Emergency Department if symptoms worsen or do not improve in the next few days.       ED Prescriptions     Medication Sig Dispense Auth. Provider   GI Cocktail (alum & mag hydroxide, lidocaine, dicyclomine) oral mixture Take 30 mLs by mouth 2 (two) times daily as needed. 550 mL Ivette Loyal, NP      PDMP not reviewed this encounter.   Ivette Loyal, NP 01/10/21 [phone]

## 2021-01-10 NOTE — ED Triage Notes (Signed)
Pt was seen in the ER on 01/06/21 for chest pain. She had workup done and advised to F/U with cardiology. She has an appt on 01/15/21, but she can not deal with the pain in her chest.

## 2021-01-10 NOTE — Discharge Instructions (Addendum)
You can take the GI Cocktail twice a day as needed for pain and acid reflux.   Make sure you are drinking plenty of fluids, especially water.  Do not lay down immediately after eating.  Try to avoid fried, spicy, or high acidity foods.   Return or go to the Emergency Department if symptoms worsen or do not improve in the next few days.

## 2021-01-15 ENCOUNTER — Encounter: Payer: Self-pay | Admitting: Medical

## 2021-01-15 ENCOUNTER — Ambulatory Visit (INDEPENDENT_AMBULATORY_CARE_PROVIDER_SITE_OTHER): Payer: Managed Care, Other (non HMO) | Admitting: Medical

## 2021-01-15 ENCOUNTER — Other Ambulatory Visit: Payer: Self-pay

## 2021-01-15 VITALS — BP 112/72 | HR 71 | Ht 65.0 in | Wt 180.2 lb

## 2021-01-15 DIAGNOSIS — R079 Chest pain, unspecified: Secondary | ICD-10-CM | POA: Diagnosis not present

## 2021-01-15 DIAGNOSIS — R002 Palpitations: Secondary | ICD-10-CM

## 2021-01-15 NOTE — Patient Instructions (Signed)
Medication Instructions:  TAKE NEXIUM DAILY FOR 2 WEEKS-MAY TAKE OTC-PEPCID FOR BREAKTHRU FOR THE NEXT 2 WEEKS.  *If you need a refill on your cardiac medications before your next appointment, please call your pharmacy*  Lab Work:   Testing/Procedures:  NONE    NONE  Follow-Up: Your next appointment:  AS NEEDED  with You may see Parke Poisson, MD or one of the following Advanced Practice Providers on your designated Care Team:   Juanda Crumble, PA-C Joni Reining, DNP, ANP   At [facility], you and your health needs are our priority.  As part of our continuing mission to provide you with exceptional heart care, we have created designated Provider Care Teams.  These Care Teams include your primary Cardiologist (physician) and Advanced Practice Providers (APPs -  Physician Assistants and Nurse Practitioners) who all work together to provide you with the care you need, when you need it.

## 2021-01-15 NOTE — Progress Notes (Signed)
Cardiology Office Note   Date:  01/15/2021   ID:  Susan Barry, DOB 1995/04/28, MRN [id number]  PCP:  Pcp, No  Cardiologist:  Parke Poisson, MD EP: None  Chief Complaint  Patient presents with   Chest Pain       History of Present Illness: Susan Barry is a 25 y.o. female with no significant PMH who presents for evaluation of chest pain.  She was last evaluated by cardiology at an outpatient visit with Dr. Jacques Navy 12/12/20 to establish care for recent palpitations. This was an isolated incidence of HR in the 110s. She was recommended for watchful waiting and consideration for a 2 week monitor/echocardiogram if symptoms reoccur. Since that time she has had 2 ED visits for chest pain (01/06/21 and 01/10/21) which were felt to be GI in nature after symptoms improved with GI cocktail. EKG 01/06/21 showed sinus rhythm with early repol, though no STE/D.   She presents today for ED follow-up. She took nexium following her ED visit 01/10/21 but none over the past 3 days. She did not notice any meaningful benefit while on nexium though we discussed that a 2 week course may be necessary to improve her suspected reflux flare. Her chest pain is sharp at times and dull achy at others, occurs at rest with no associated SOB, DOE, palpitations, dizziness, lightheadedness, syncope, nausea, or vomiting. She continues to exercise several days per week and has not had any exertional chest pain. She denies orthopnea, PND, or LE edema. She has no risk factors for CAD/CHF and no family history of CAD. She does not smoke, drinks ETOH socially, and denies illicit drugs. She reports this is her busy season at work but has not been significantly stressed recently.      Past Medical History:  Diagnosis Date   Ovarian cyst     No past surgical history on file.   Current Outpatient Medications  Medication Sig Dispense Refill   ESTARYLLA 0.25-35 MG-MCG tablet Take 1 tablet by mouth daily.      esomeprazole (NEXIUM) 20 MG capsule Take 20 mg by mouth daily at 12 noon.     GI Cocktail (alum & mag hydroxide, lidocaine, dicyclomine) oral mixture Take 30 mLs by mouth 2 (two) times daily as needed. (Patient not taking: Reported on 01/15/2021) 550 mL 0   ibuprofen (ADVIL) 800 MG tablet Take by mouth. (Patient not taking: Reported on 12/12/2020)     No current facility-administered medications for this visit.    Allergies:   Patient has no known allergies.    Social History:  The patient  reports that she has never smoked. She has never used smokeless tobacco. She reports current alcohol use. She reports that she does not use drugs.   Family History:  The patient's family history is not on file.    ROS:  Please see the history of present illness.   Otherwise, review of systems are positive for none.   All other systems are reviewed and negative.    PHYSICAL EXAM: VS:  BP 112/72 (BP Location: Left Arm, Patient Position: Sitting, Cuff Size: Normal)   Pulse 71   Ht 5\' 5"  (1.651 m)   Wt 180 lb 3.2 oz (81.7 kg)   SpO2 97%   BMI 29.99 kg/m  , BMI Body mass index is 29.99 kg/m. GEN: Well nourished, well developed, in no acute distress HEENT: sclera anicteric Neck: no JVD, carotid bruits, or masses Cardiac: RRR; no murmurs, rubs, or gallops,no edema  Respiratory:  clear to auscultation bilaterally, normal work of breathing GI: soft, nontender, nondistended, + BS MS: no deformity or atrophy Skin: warm and dry, no rash Neuro:  Strength and sensation are intact Psych: euthymic mood, full affect   EKG:  EKG is ordered today. The ekg ordered today demonstrates sinus rhythm, rate 71 bpm, no STE/D, no significant change from previous   Recent Labs: 12/06/2020: Platelets 332 01/06/2021: BUN 11; Creatinine, Ser 0.90; Hemoglobin 12.2; Potassium 4.1; Sodium 138    Lipid Panel No results found for: CHOL, TRIG, HDL, CHOLHDL, VLDL, LDLCALC, LDLDIRECT    Wt Readings from Last 3 Encounters:   01/15/21 180 lb 3.2 oz (81.7 kg)  01/06/21 180 lb (81.6 kg)  12/12/20 182 lb 3.2 oz (82.6 kg)      Other studies Reviewed: Additional studies/ records that were reviewed today include:   None    ASSESSMENT AND PLAN:  1. Chest pain: not c/w cardiac related chest pain. Likely GI in etiology. EKG today without ischemia. No exertional chest pain, SOB, DOE, orthopnea, PND, or LE edema. She has no significant risk factors for CAD or CHF.  - Encouraged compliance with nexium x2 weeks - recommend taking every morning before food with prn OTC pepcid for breakthrough pain.  - If symptoms persist despite nexium, would consider obtaining an echocardiogram to evaluate LV function, wall motion, and valve function.   2. Palpitations: well controlled - Continue to limit stimulates    Current medicines are reviewed at length with the patient today.  The patient does not have concerns regarding medicines.  The following changes have been made:  As above  Labs/ tests ordered today include:   Orders Placed This Encounter  Procedures   EKG 12-Lead     Disposition:   FU with Dr. Jacques Navy as needed  Signed, Beatriz Stallion, PA-C  01/15/2021 10:27 AM

## 2021-01-22 DIAGNOSIS — R002 Palpitations: Secondary | ICD-10-CM

## 2021-01-22 DIAGNOSIS — R079 Chest pain, unspecified: Secondary | ICD-10-CM

## 2021-01-25 ENCOUNTER — Ambulatory Visit
Admission: RE | Admit: 2021-01-25 | Discharge: 2021-01-25 | Disposition: A | Discharge status: Home / Self Care | Payer: Managed Care, Other (non HMO) | Source: Ambulatory Visit | Attending: Emergency Medicine | Admitting: Emergency Medicine

## 2021-01-25 ENCOUNTER — Other Ambulatory Visit: Payer: Self-pay

## 2021-01-25 DIAGNOSIS — G44209 Tension-type headache, unspecified, not intractable: Secondary | ICD-10-CM

## 2021-01-25 DIAGNOSIS — R110 Nausea: Secondary | ICD-10-CM | POA: Diagnosis not present

## 2021-01-25 MED ORDER — NAPROXEN 500 MG PO TABS: 500.0000 mg | ORAL_TABLET | Freq: Two times a day (BID) | ORAL | 0 refills | Status: DC

## 2021-01-25 MED ORDER — PROMETHAZINE HCL 25 MG PO TABS: 25.0000 mg | ORAL_TABLET | Freq: Three times a day (TID) | ORAL | 0 refills | Status: DC | PRN
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
## 2021-01-25 NOTE — Discharge Instructions (Addendum)
For your nausea, you can take promethazine (Phenergan) 25 mg 3 times daily as needed.  Because this medication can make you sleepy, you may choose to not take it in the daytime and only take it at bedtime, if you you choose to use promethazine to help you sleep you can take 2 tablets for total of 50 mg at bedtime.  I recommend that you change your nonsteroidal anti-inflammatory medication from ibuprofen to naproxen for the added benefit as prostaglandin inhibition.  Please take naproxen twice daily for the next 3 to 5 days regardless of your level of discomfort, it will help reduce inflammation causing your headaches and may also to some degree address your nausea.
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
## 2021-01-28 ENCOUNTER — Encounter (HOSPITAL_BASED_OUTPATIENT_CLINIC_OR_DEPARTMENT_OTHER): Payer: Self-pay

## 2021-01-28 ENCOUNTER — Other Ambulatory Visit: Payer: Self-pay

## 2021-01-28 ENCOUNTER — Emergency Department (HOSPITAL_BASED_OUTPATIENT_CLINIC_OR_DEPARTMENT_OTHER)
Admission: EM | Admit: 2021-01-28 | Discharge: 2021-01-29 | Disposition: A | Discharge status: Home / Self Care | Payer: Managed Care, Other (non HMO) | Attending: Emergency Medicine | Admitting: Emergency Medicine

## 2021-01-28 ENCOUNTER — Emergency Department (HOSPITAL_BASED_OUTPATIENT_CLINIC_OR_DEPARTMENT_OTHER): Payer: Managed Care, Other (non HMO)

## 2021-01-28 DIAGNOSIS — Y92410 Unspecified street and highway as the place of occurrence of the external cause: Secondary | ICD-10-CM | POA: Diagnosis not present

## 2021-01-28 DIAGNOSIS — S060X0A Concussion without loss of consciousness, initial encounter: Secondary | ICD-10-CM | POA: Diagnosis present

## 2021-01-28 MED ORDER — ACETAMINOPHEN 500 MG PO TABS
1000.0000 mg | ORAL_TABLET | Freq: Once | ORAL | Status: AC
  Administered 2021-01-28: 1000 mg via ORAL
  Filled 2021-01-28: qty 2

## 2021-01-28 MED ORDER — DIPHENHYDRAMINE HCL 25 MG PO CAPS
25.0000 mg | ORAL_CAPSULE | Freq: Once | ORAL | Status: AC
  Administered 2021-01-28: 25 mg via ORAL
  Filled 2021-01-28: qty 1

## 2021-01-28 MED ORDER — PROCHLORPERAZINE MALEATE 10 MG PO TABS
10.0000 mg | ORAL_TABLET | Freq: Once | ORAL | Status: AC
  Administered 2021-01-28: 10 mg via ORAL
  Filled 2021-01-28: qty 1

## 2021-01-28 NOTE — Discharge Instructions (Signed)
You were evaluated in the Emergency Department and after careful evaluation, we did not find any emergent condition requiring admission or further testing in the hospital.  Your exam/testing today was overall reassuring.  Symptoms likely due to a concussion.  Please practice mental and physical rest for the next 2 days as we discussed.  Please return to the Emergency Department if you experience any worsening of your condition.  Thank you for allowing [place] to be a part of your care.
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
## 2021-01-29 ENCOUNTER — Ambulatory Visit: Payer: Managed Care, Other (non HMO)

## 2021-01-29 DIAGNOSIS — R079 Chest pain, unspecified: Secondary | ICD-10-CM

## 2021-01-29 DIAGNOSIS — R002 Palpitations: Secondary | ICD-10-CM

## 2021-01-29 NOTE — Progress Notes (Unsigned)
Patient enrolled for [employer] to mail a 14 day ZIO XT monitor to her addresss on file.

## 2021-01-29 NOTE — Telephone Encounter (Signed)
Parke Poisson, MD to Beatriz Stallion., PA-C  Me     5:27 PM Agree, thank you Dot Lanes. Let's also get an echo.  [place]  Kroeger, Ovidio Kin., PA-C to Me  Parke Poisson, MD     5:15 PM Hey there - thanks for the info. While my suspicions are low for dangerous arrhythmias, for completeness sake, lets get a 2 week non-live zio patch monitor on her to r/o arrhythmia. If anything concerning we can arrange follow-up thereafter. Thanks for your help!    Orders have been placed.
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
## 2021-02-01 ENCOUNTER — Other Ambulatory Visit: Payer: Self-pay

## 2021-02-01 ENCOUNTER — Ambulatory Visit (INDEPENDENT_AMBULATORY_CARE_PROVIDER_SITE_OTHER): Payer: Managed Care, Other (non HMO) | Admitting: Sports Medicine

## 2021-02-01 VITALS — BP 134/80 | Ht 65.0 in | Wt 181.0 lb

## 2021-02-01 DIAGNOSIS — G4709 Other insomnia: Secondary | ICD-10-CM | POA: Diagnosis not present

## 2021-02-01 DIAGNOSIS — S060X0A Concussion without loss of consciousness, initial encounter: Secondary | ICD-10-CM

## 2021-02-01 DIAGNOSIS — G44319 Acute post-traumatic headache, not intractable: Secondary | ICD-10-CM | POA: Diagnosis not present

## 2021-02-01 NOTE — Patient Instructions (Signed)
Start melatonin max 5 mg nightly Can return 4 hours max Rest breaks as needed See you again in 1 week

## 2021-02-04 MED ORDER — TRAZODONE HCL 50 MG PO TABS: 50.0000 mg | ORAL_TABLET | Freq: Every evening | ORAL | 0 refills | Status: DC | PRN

## 2021-02-04 NOTE — Addendum Note (Signed)
Addended by: [name] R on: 02/04/2021 10:35 AM   Modules accepted: Orders
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
## 2021-02-07 ENCOUNTER — Other Ambulatory Visit: Payer: Self-pay

## 2021-02-07 ENCOUNTER — Ambulatory Visit (INDEPENDENT_AMBULATORY_CARE_PROVIDER_SITE_OTHER): Payer: Managed Care, Other (non HMO) | Admitting: Sports Medicine

## 2021-02-07 VITALS — BP 140/76 | Ht 65.0 in | Wt 181.0 lb

## 2021-02-07 DIAGNOSIS — G44319 Acute post-traumatic headache, not intractable: Secondary | ICD-10-CM

## 2021-02-07 DIAGNOSIS — S060X0D Concussion without loss of consciousness, subsequent encounter: Secondary | ICD-10-CM | POA: Diagnosis not present

## 2021-02-07 DIAGNOSIS — G4709 Other insomnia: Secondary | ICD-10-CM

## 2021-02-07 MED ORDER — TRAZODONE HCL 50 MG PO TABS: 50.0000 mg | ORAL_TABLET | Freq: Every evening | ORAL | 0 refills | Status: DC | PRN

## 2021-02-07 NOTE — Patient Instructions (Signed)
Increase Trazodone to 100mg  nightly 6 hours work days starting Monday 7th, rest breaks as needed Follow up in 1 week

## 2021-02-08 ENCOUNTER — Ambulatory Visit (HOSPITAL_COMMUNITY): Payer: Managed Care, Other (non HMO) | Attending: Internal Medicine

## 2021-02-08 DIAGNOSIS — R079 Chest pain, unspecified: Secondary | ICD-10-CM

## 2021-02-08 DIAGNOSIS — R002 Palpitations: Secondary | ICD-10-CM | POA: Insufficient documentation

## 2021-02-08 LAB — ECHOCARDIOGRAM COMPLETE
Area-P 1/2: 4.6 cm2
S' Lateral: 3.2 cm
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
## 2021-02-14 ENCOUNTER — Ambulatory Visit (INDEPENDENT_AMBULATORY_CARE_PROVIDER_SITE_OTHER): Payer: Managed Care, Other (non HMO) | Admitting: Sports Medicine

## 2021-02-14 ENCOUNTER — Other Ambulatory Visit: Payer: Self-pay

## 2021-02-14 VITALS — BP 124/70 | HR 82 | Ht 65.0 in | Wt 180.0 lb

## 2021-02-14 DIAGNOSIS — G4709 Other insomnia: Secondary | ICD-10-CM

## 2021-02-14 DIAGNOSIS — G44319 Acute post-traumatic headache, not intractable: Secondary | ICD-10-CM | POA: Diagnosis not present

## 2021-02-14 DIAGNOSIS — S060X0D Concussion without loss of consciousness, subsequent encounter: Secondary | ICD-10-CM | POA: Diagnosis not present

## 2021-02-14 NOTE — Patient Instructions (Addendum)
Good to see you  Can return to full days at work taking rest breaks as needed  Can continue trazadone for sleep with goal of weaning off of it  Follow up in 2 weeks

## 2021-02-15 ENCOUNTER — Other Ambulatory Visit: Payer: Self-pay

## 2021-02-15 ENCOUNTER — Emergency Department (HOSPITAL_BASED_OUTPATIENT_CLINIC_OR_DEPARTMENT_OTHER)
Admission: EM | Admit: 2021-02-15 | Discharge: 2021-02-15 | Disposition: A | Discharge status: Home / Self Care | Payer: Managed Care, Other (non HMO) | Attending: Emergency Medicine | Admitting: Emergency Medicine

## 2021-02-15 ENCOUNTER — Encounter (HOSPITAL_BASED_OUTPATIENT_CLINIC_OR_DEPARTMENT_OTHER): Payer: Self-pay | Admitting: Urology

## 2021-02-15 DIAGNOSIS — R519 Headache, unspecified: Secondary | ICD-10-CM | POA: Diagnosis present

## 2021-02-15 DIAGNOSIS — F0781 Postconcussional syndrome: Secondary | ICD-10-CM | POA: Diagnosis not present

## 2021-02-15 HISTORY — DX: Concussion with loss of consciousness status unknown, initial encounter: S06.0XAA

## 2021-02-15 MED ORDER — KETOROLAC TROMETHAMINE 30 MG/ML IJ SOLN
30.0000 mg | Freq: Once | INTRAMUSCULAR | Status: AC
  Administered 2021-02-15: 30 mg via INTRAMUSCULAR
  Filled 2021-02-15: qty 1

## 2021-02-15 NOTE — ED Triage Notes (Signed)
Pt reports HA that started this morning, h/o concussion 3 weeks ago.  Seeing concussion clinic, saw yesterday.  Denies any vision changes

## 2021-02-15 NOTE — ED Provider Notes (Signed)
[facility] EMERGENCY DEPARTMENT Provider Note   CSN: [id number] Arrival date & time: 02/15/21  1716     History Chief Complaint  Patient presents with   Headache    Susan Barry is a 25 y.o. female.  Patient presents to the emergency department for evaluation of worsening headache.  Patient was diagnosed with a concussion about 3 weeks ago.  She has had intermittent headaches, waxing and waning.  She has followed up with [facility] who are monitoring her symptoms.  No vision change, confusion.  Pain is a tension kind of pain around her head and down into her neck.  She has light sensitivity.  No vomiting today.  Symptoms were worse today prompting ED visit.  She states that yesterday she was actually feeling improved.  Not improved with home meds.  No new injuries.  No anticoagulation.  Patient drove to the emergency department today.      Past Medical History:  Diagnosis Date   Concussion    Ovarian cyst     There are no problems to display for this patient.   History reviewed. No pertinent surgical history.   OB History   No obstetric history on file.     History reviewed. No pertinent family history.  Social History   Tobacco Use   Smoking status: Never   Smokeless tobacco: Never  Vaping Use   Vaping Use: Never used  Substance Use Topics   Alcohol use: Yes    Comment: occ   Drug use: Never    Home Medications Prior to Admission medications   Medication Sig Start Date End Date Taking? Authorizing Provider  esomeprazole (NEXIUM) 20 MG capsule Take 20 mg by mouth daily at 12 noon.    [provider]  ESTARYLLA 0.25-35 MG-MCG tablet Take 1 tablet by mouth daily. 09/24/20   [provider]  naproxen (NAPROSYN) 500 MG tablet Take 1 tablet (500 mg total) by mouth 2 (two) times daily. 01/25/21   Lynden Oxford Scales, PA-C  promethazine (PHENERGAN) 25 MG tablet Take 1 tablet (25 mg total) by mouth every 8 (eight)  hours as needed for up to 5 days for nausea or vomiting. 01/25/21 01/30/21  Lynden Oxford Scales, PA-C  traZODone (DESYREL) 50 MG tablet Take 1 tablet (50 mg total) by mouth at bedtime as needed for sleep. 02/07/21   Glennon Mac, DO    Allergies    Patient has no known allergies.  Review of Systems   Review of Systems  Constitutional:  Negative for fever.  HENT:  Negative for congestion, dental problem, rhinorrhea and sinus pressure.   Eyes:  Positive for photophobia. Negative for discharge, redness and visual disturbance.  Respiratory:  Negative for shortness of breath.   Cardiovascular:  Negative for chest pain.  Gastrointestinal:  Negative for nausea and vomiting.  Musculoskeletal:  Positive for neck pain. Negative for gait problem and neck stiffness.  Skin:  Negative for rash.  Neurological:  Positive for headaches. Negative for syncope, speech difficulty, weakness, light-headedness and numbness.  Psychiatric/Behavioral:  Negative for confusion.    Physical Exam Updated Vital Signs BP (!) 142/93 (BP Location: Left Arm)   Pulse (!) 116   Temp 98.4 F (36.9 C) (Oral)   Resp 18   Ht 5\' 5"  (1.651 m)   Wt 81.6 kg   LMP 01/16/2021   SpO2 100%   BMI 29.95 kg/m   Physical Exam Vitals and nursing note reviewed.  Constitutional:  General: She is not in acute distress.    Appearance: She is well-developed.  HENT:     Head: Normocephalic and atraumatic.     Right Ear: Tympanic membrane, ear canal and external ear normal.     Left Ear: Tympanic membrane, ear canal and external ear normal.     Nose: Nose normal.     Mouth/Throat:     Pharynx: Uvula midline.  Eyes:     General: Lids are normal.     Extraocular Movements:     Right eye: No nystagmus.     Left eye: No nystagmus.     Conjunctiva/sclera: Conjunctivae normal.     Pupils: Pupils are equal, round, and reactive to light.  Cardiovascular:     Rate and Rhythm: Normal rate and regular rhythm.     Heart  sounds: No murmur heard. Pulmonary:     Effort: Pulmonary effort is normal. No respiratory distress.     Breath sounds: Normal breath sounds. No wheezing, rhonchi or rales.  Abdominal:     Palpations: Abdomen is soft.     Tenderness: There is no abdominal tenderness. There is no guarding or rebound.  Musculoskeletal:     Cervical back: Normal range of motion and neck supple. No tenderness or bony tenderness.     Right lower leg: No edema.     Left lower leg: No edema.  Skin:    General: Skin is warm and dry.     Findings: No rash.  Neurological:     General: No focal deficit present.     Mental Status: She is alert and oriented to person, place, and time. Mental status is at baseline.     GCS: GCS eye subscore is 4. GCS verbal subscore is 5. GCS motor subscore is 6.     Cranial Nerves: No cranial nerve deficit.     Sensory: No sensory deficit.     Motor: No weakness.     Coordination: Coordination normal.     Gait: Gait normal.     Comments: Patient is able to stand from a sitting position and ambulate without any difficulty.  She is able to do heel-to-toe walking without losing her balance.   Psychiatric:        Mood and Affect: Mood normal.    ED Results / Procedures / Treatments   Labs (all labs ordered are listed, but only abnormal results are displayed) Labs Reviewed - No data to display  EKG None  Radiology No results found.  Procedures Procedures   Medications Ordered in ED Medications  ketorolac (TORADOL) 30 MG/ML injection 30 mg (has no administration in time range)    ED Course  I have reviewed the triage vital signs and the nursing notes.  Pertinent labs & imaging results that were available during my care of the patient were reviewed by me and considered in my medical decision making (see chart for details).  Patient seen and examined.  Discussed concussion symptoms with patient.  Will treat symptomatically.  Discussed limited utility of repeat  noncontrast head CT.  We will give IM Toradol as patient drove to the emergency department.  Vital signs reviewed and are as follows: BP (!) 142/93 (BP Location: Left Arm)   Pulse (!) 116   Temp 98.4 F (36.9 C) (Oral)   Resp 18   Ht 5\' 5"  (1.651 m)   Wt 81.6 kg   LMP 01/16/2021   SpO2 100%   BMI 29.95 kg/m   6:31  PM patient rechecked.  Exam is unchanged.  She tolerated the Toradol well.  Encouraged discharged home with rest as able.  She states that sleep has been difficult for her given her ongoing concussion symptoms.  Patient urged to return with worsening symptoms or other concerns. Patient verbalized understanding and agrees with plan.      MDM Rules/Calculators/A&P                           Patient with headaches in setting of recent concussion.  Patient without high-risk features of headache including: sudden onset/thunderclap HA, no similar headache in past, altered mental status, accompanying seizure, headache with exertion, age > 47, history of immunocompromise, neck or shoulder pain, fever, use of anticoagulation, family history of spontaneous SAH, concomitant drug use, toxic exposure.   Patient has a normal complete neurological exam, normal vital signs, normal level of consciousness, no signs of meningismus, is well-appearing/non-toxic appearing, no signs of trauma.   Imaging with CT/MRI not indicated given history and physical exam findings.   No dangerous or life-threatening conditions suspected or identified by history, physical exam, and by work-up. No indications for hospitalization identified.   Final Clinical Impression(s) / ED Diagnoses Final diagnoses:  Acute nonintractable headache, unspecified headache type  Postconcussion syndrome    Rx / DC Orders ED Discharge Orders     None        Renne Crigler, PA-C 02/15/21 1832    Melene Plan, DO 02/15/21 1842

## 2021-02-15 NOTE — Discharge Instructions (Signed)
Please read and follow all provided instructions.  Your diagnoses today include:  1. Acute nonintractable headache, unspecified headache type     Tests performed today include: Vital signs. See below for your results today.   Medications:  In the Emergency Department you received: Toradol - NSAID medication similar to ibuprofen  Take any prescribed medications only as directed.  Additional information:  Follow any educational materials contained in this packet.  You are having a headache. No specific cause was found today for your headache. It may have been a migraine or other cause of headache. Stress, anxiety, fatigue, and depression are common triggers for headaches.   Your headache today does not appear to be life-threatening or require hospitalization, but often the exact cause of headaches is not determined in the emergency department. Therefore, follow-up with your doctor is very important to find out what may have caused your headache and whether or not you need any further diagnostic testing or treatment.   Sometimes headaches can appear benign (not harmful), but then more serious symptoms can develop which should prompt an immediate re-evaluation by your doctor or the emergency department.  BE VERY CAREFUL not to take multiple medicines containing Tylenol (also called acetaminophen). Doing so can lead to an overdose which can damage your liver and cause liver failure and possibly death.   Follow-up instructions: Please follow-up with your primary care provider in the next 3 days for further evaluation of your symptoms.   Return instructions:  Please return to the Emergency Department if you experience worsening symptoms. Return if the medications do not resolve your headache, if it recurs, or if you have multiple episodes of vomiting or cannot keep down fluids. Return if you have a change from the usual headache. RETURN IMMEDIATELY IF you: Develop a sudden, severe  headache Develop confusion or become poorly responsive or faint Develop a fever above 100.24F or problem breathing Have a change in speech, vision, swallowing, or understanding Develop new weakness, numbness, tingling, incoordination in your arms or legs Have a seizure Please return if you have any other emergent concerns.  Additional Information:  Your vital signs today were: BP (!) 142/93 (BP Location: Left Arm)   Pulse (!) 116   Temp 98.4 F (36.9 C) (Oral)   Resp 18   Ht 5\' 5"  (1.651 m)   Wt 81.6 kg   LMP 01/16/2021   SpO2 100%   BMI 29.95 kg/m  If your blood pressure (BP) was elevated above 135/85 this visit, please have this repeated by your doctor within one month. --------------
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
## 2021-02-19 ENCOUNTER — Other Ambulatory Visit: Payer: Self-pay

## 2021-02-19 ENCOUNTER — Ambulatory Visit: Payer: Managed Care, Other (non HMO) | Admitting: Sports Medicine

## 2021-02-19 VITALS — BP 116/80 | HR 80 | Ht 65.0 in | Wt 180.0 lb

## 2021-02-19 DIAGNOSIS — S060X0D Concussion without loss of consciousness, subsequent encounter: Secondary | ICD-10-CM

## 2021-02-19 DIAGNOSIS — G4709 Other insomnia: Secondary | ICD-10-CM

## 2021-02-19 DIAGNOSIS — G44319 Acute post-traumatic headache, not intractable: Secondary | ICD-10-CM | POA: Diagnosis not present

## 2021-02-19 MED ORDER — TRAZODONE HCL 50 MG PO TABS: 50.0000 mg | ORAL_TABLET | Freq: Every evening | ORAL | 0 refills | Status: DC | PRN

## 2021-02-19 NOTE — Patient Instructions (Addendum)
Good to see you Work note given half days of work for the next one week  Take Tylenol for headaches  Refill of trazodone given take 2-3 tablets as needed at night for sleep

## 2021-02-21 ENCOUNTER — Other Ambulatory Visit: Payer: Self-pay

## 2021-02-21 ENCOUNTER — Emergency Department (HOSPITAL_COMMUNITY)
Admission: EM | Admit: 2021-02-21 | Discharge: 2021-02-21 | Disposition: A | Discharge status: Home / Self Care | Payer: Managed Care, Other (non HMO) | Attending: Emergency Medicine | Admitting: Emergency Medicine

## 2021-02-21 ENCOUNTER — Emergency Department (HOSPITAL_COMMUNITY): Payer: Managed Care, Other (non HMO)

## 2021-02-21 ENCOUNTER — Encounter (HOSPITAL_COMMUNITY): Payer: Self-pay | Admitting: Emergency Medicine

## 2021-02-21 DIAGNOSIS — H53149 Visual discomfort, unspecified: Secondary | ICD-10-CM | POA: Diagnosis not present

## 2021-02-21 DIAGNOSIS — R519 Headache, unspecified: Secondary | ICD-10-CM | POA: Insufficient documentation

## 2021-02-21 DIAGNOSIS — F0781 Postconcussional syndrome: Secondary | ICD-10-CM | POA: Insufficient documentation

## 2021-02-21 NOTE — ED Triage Notes (Signed)
Per pt, states she has been diagnosed with concussion syndrome-states she has been seeing neurologist-states headache not getting better-neuro told to come to ED for MRI
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
## 2021-02-26 ENCOUNTER — Ambulatory Visit: Payer: Managed Care, Other (non HMO) | Admitting: Sports Medicine

## 2021-02-26 ENCOUNTER — Other Ambulatory Visit: Payer: Self-pay

## 2021-02-26 VITALS — BP 136/82 | HR 79 | Ht 65.0 in | Wt 180.0 lb

## 2021-02-26 DIAGNOSIS — G4709 Other insomnia: Secondary | ICD-10-CM

## 2021-02-26 DIAGNOSIS — S060X0D Concussion without loss of consciousness, subsequent encounter: Secondary | ICD-10-CM | POA: Diagnosis not present

## 2021-02-26 DIAGNOSIS — G44319 Acute post-traumatic headache, not intractable: Secondary | ICD-10-CM | POA: Diagnosis not present

## 2021-02-26 MED ORDER — TRAZODONE HCL 150 MG PO TABS: 150.0000 mg | ORAL_TABLET | Freq: Every day | ORAL | 0 refills | Status: DC

## 2021-02-26 NOTE — Patient Instructions (Addendum)
Good to see you  Work note given  Refill trazodone 150mg  nightly  Neck exercises given  Follow up 1 week
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
## 2021-03-06 ENCOUNTER — Ambulatory Visit: Payer: Managed Care, Other (non HMO) | Admitting: Sports Medicine

## 2021-03-07 ENCOUNTER — Encounter: Payer: Self-pay | Admitting: Family

## 2021-03-07 ENCOUNTER — Ambulatory Visit (INDEPENDENT_AMBULATORY_CARE_PROVIDER_SITE_OTHER): Payer: Managed Care, Other (non HMO) | Admitting: Family

## 2021-03-07 ENCOUNTER — Other Ambulatory Visit: Payer: Self-pay

## 2021-03-07 VITALS — BP 125/85 | HR 82 | Temp 98.0°F | Ht 65.0 in | Wt 176.0 lb

## 2021-03-07 DIAGNOSIS — Z87820 Personal history of traumatic brain injury: Secondary | ICD-10-CM | POA: Diagnosis not present

## 2021-03-07 DIAGNOSIS — F5102 Adjustment insomnia: Secondary | ICD-10-CM | POA: Diagnosis not present
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
## 2021-03-07 NOTE — Patient Instructions (Signed)
Welcome to [facility]! It was a pleasure meeting you today.  As discussed, I recommend you try OTC generic Benadryl, up to 50mg  30 min-1 hour prior to bedtime to help with sleep. Try to wean or ok to stop taking after a few nights or a week of taking, as it will not work as well nightly. If not working, ok to take 1 Benadryl with 1 Trazodone. Please schedule a physical with fasting labs at your convenience today.  PLEASE NOTE:  If you had any LAB tests please let know if you have not heard back within a few days. You may see your results on MyChart before we have a chance to review them but we will give you a call once they are reviewed by [place]. If we ordered any REFERRALS today, please let us know if you have not heard from their office within the next week.  Let us know through MyChart if you are needing REFILLS, or have your pharmacy send [place] the request. You can also use MyChart to communicate with me or any office staff.  Please try these tips to maintain a healthy lifestyle:  Eat most of your calories during the day when you are active. Eliminate processed foods including packaged sweets (pies, cakes, cookies), reduce intake of potatoes, white bread, white pasta, and white rice. Look for whole grain options, oat flour or almond flour.  Each meal should contain half fruits/vegetables, one quarter protein, and one quarter carbs (no bigger than a computer mouse).  Cut down on sweet beverages. This includes juice, soda, and sweet tea. Also watch fruit intake, though this is a healthier sweet option, it still contains natural sugar! Limit to 3 servings daily.  Drink at least 1 glass of water with each meal and aim for at least 8 glasses per day  Exercise at least 150 minutes every week.
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
## 2021-03-13 ENCOUNTER — Ambulatory Visit: Payer: Managed Care, Other (non HMO) | Admitting: Sports Medicine

## 2021-03-13 ENCOUNTER — Other Ambulatory Visit: Payer: Self-pay

## 2021-03-13 VITALS — BP 122/82 | HR 83 | Ht 65.0 in | Wt 177.0 lb

## 2021-03-13 DIAGNOSIS — G44319 Acute post-traumatic headache, not intractable: Secondary | ICD-10-CM

## 2021-03-13 DIAGNOSIS — G4709 Other insomnia: Secondary | ICD-10-CM

## 2021-03-13 DIAGNOSIS — S060X0D Concussion without loss of consciousness, subsequent encounter: Secondary | ICD-10-CM

## 2021-03-13 NOTE — Patient Instructions (Addendum)
Congratulations !!  Follow up as need

## 2021-03-18 ENCOUNTER — Other Ambulatory Visit: Payer: Self-pay | Admitting: Sports Medicine

## 2021-03-22 ENCOUNTER — Encounter: Payer: Self-pay | Admitting: Family

## 2021-03-22 ENCOUNTER — Ambulatory Visit (INDEPENDENT_AMBULATORY_CARE_PROVIDER_SITE_OTHER): Payer: Managed Care, Other (non HMO) | Admitting: Family

## 2021-03-22 ENCOUNTER — Other Ambulatory Visit: Payer: Self-pay

## 2021-03-22 VITALS — BP 162/79 | HR 83 | Temp 97.9°F | Ht 65.0 in | Wt 183.6 lb

## 2021-03-22 DIAGNOSIS — R053 Chronic cough: Secondary | ICD-10-CM

## 2021-03-22 HISTORY — DX: Chronic cough: R05.3

## 2021-03-22 MED ORDER — METHYLPREDNISOLONE ACETATE 80 MG/ML IJ SUSP
80.0000 mg | Freq: Once | INTRAMUSCULAR | Status: AC
Start: 2021-03-22 — End: 2021-03-22
  Administered 2021-03-22: 80 mg via INTRAMUSCULAR

## 2021-03-22 NOTE — Progress Notes (Signed)
Pt had Depro Medrol 80 injection in the office today. She tolerated well.
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
## 2021-03-22 NOTE — Assessment & Plan Note (Signed)
W/pleuritic chest pain, advised on Ibuprofen 600mg  tid or generic Aleve 1-2 tabs bid. Continue to drink plenty of water, mucinex, cough drops prn.

## 2021-03-22 NOTE — Patient Instructions (Addendum)
It was very nice to see you today!  As discussed, to help with the pleuritic pain in your chest you can take up to 600mg  of Ibuprofen/Advil 3 times/day or generic Aleve 1-2 tabs twice a day for the next 5 days. Eat a little something first to avoid stomach upset.  Your blood pressure is elevated today which can be from coughing/not feeling well, BUT,  be sure to drink a lot of water, up to 2liters daily, avoid sodium in diet.   PLEASE NOTE:  If you had any lab tests please let know if you have not heard back within a few days. You may see your results on MyChart before we have a chance to review them but we will give you a call once they are reviewed by [place]. If we ordered any referrals today, please let us know if you have not heard from their office within the next week.   Please try these tips to maintain a healthy lifestyle:  Eat most of your calories during the day when you are active. Eliminate processed foods including packaged sweets (pies, cakes, cookies), reduce intake of potatoes, white bread, white pasta, and white rice. Look for whole grain options, oat flour or almond flour.  Each meal should contain half fruits/vegetables, one quarter protein, and one quarter carbs (no bigger than a computer mouse).  Cut down on sweet beverages. This includes juice, soda, and sweet tea. Also watch fruit intake, though this is a healthier sweet option, it still contains natural sugar! Limit to 3 servings daily.  Drink at least 1 glass of water with each meal and aim for at least 8 glasses per day  Exercise at least 150 minutes every week.

## 2021-03-28 ENCOUNTER — Ambulatory Visit: Payer: Managed Care, Other (non HMO) | Admitting: Family

## 2021-04-11 ENCOUNTER — Other Ambulatory Visit: Payer: Self-pay

## 2021-04-11 ENCOUNTER — Ambulatory Visit (INDEPENDENT_AMBULATORY_CARE_PROVIDER_SITE_OTHER): Payer: Managed Care, Other (non HMO) | Admitting: Family

## 2021-04-11 ENCOUNTER — Encounter: Payer: Self-pay | Admitting: Family

## 2021-04-11 VITALS — BP 109/71 | HR 79 | Temp 97.9°F | Ht 65.0 in | Wt 182.2 lb

## 2021-04-11 DIAGNOSIS — Z0000 Encounter for general adult medical examination without abnormal findings: Secondary | ICD-10-CM | POA: Insufficient documentation

## 2021-04-11 HISTORY — DX: Encounter for general adult medical examination without abnormal findings: Z00.00

## 2021-04-11 LAB — CBC WITH DIFFERENTIAL/PLATELET
Basophils Absolute: 0 10*3/uL (ref 0.0–0.1)
Basophils Relative: 0.1 % (ref 0.0–3.0)
Eosinophils Absolute: 0 10*3/uL (ref 0.0–0.7)
Eosinophils Relative: 0.4 % (ref 0.0–5.0)
HCT: 34.9 % — ABNORMAL LOW (ref 36.0–46.0)
Hemoglobin: 11.7 g/dL — ABNORMAL LOW (ref 12.0–15.0)
Lymphocytes Relative: 20.1 % (ref 12.0–46.0)
Lymphs Abs: 1.9 10*3/uL (ref 0.7–4.0)
MCHC: 33.7 g/dL (ref 30.0–36.0)
MCV: 89 fl (ref 78.0–100.0)
Monocytes Absolute: 0.9 10*3/uL (ref 0.1–1.0)
Monocytes Relative: 9.5 % (ref 3.0–12.0)
Neutro Abs: 6.8 10*3/uL (ref 1.4–7.7)
Neutrophils Relative %: 69.9 % (ref 43.0–77.0)
Platelets: 308 10*3/uL (ref 150.0–400.0)
RBC: 3.92 Mil/uL (ref 3.87–5.11)
RDW: 14 % (ref 11.5–15.5)
WBC: 9.7 10*3/uL (ref 4.0–10.5)

## 2021-04-11 LAB — COMPREHENSIVE METABOLIC PANEL
ALT: 14 U/L (ref 0–35)
AST: 18 U/L (ref 0–37)
Albumin: 4 g/dL (ref 3.5–5.2)
Alkaline Phosphatase: 45 U/L (ref 39–117)
BUN: 9 mg/dL (ref 6–23)
CO2: 25 mEq/L (ref 19–32)
Calcium: 9.1 mg/dL (ref 8.4–10.5)
Chloride: 105 mEq/L (ref 96–112)
Creatinine, Ser: 0.89 mg/dL (ref 0.40–1.20)
GFR: 89.59 mL/min (ref >60.00)
Glucose, Bld: 91 mg/dL (ref 70–99)
Potassium: 4.1 mEq/L (ref 3.5–5.1)
Sodium: 137 mEq/L (ref 135–145)
Total Bilirubin: 0.4 mg/dL (ref 0.2–1.2)
Total Protein: 7 g/dL (ref 6.0–8.3)

## 2021-04-11 LAB — TSH: TSH: 1.37 u[IU]/mL (ref 0.35–5.50)

## 2021-04-11 LAB — LIPID PANEL
Cholesterol: 217 mg/dL — ABNORMAL HIGH (ref 0–200)
HDL: 72.6 mg/dL (ref >39.00)
LDL Cholesterol: 128 mg/dL — ABNORMAL HIGH (ref 0–99)
NonHDL: 144.54
Total CHOL/HDL Ratio: 3
Triglycerides: 85 mg/dL (ref 0.0–149.0)
VLDL: 17 mg/dL (ref 0.0–40.0)

## 2021-04-11 LAB — VITAMIN D 25 HYDROXY (VIT D DEFICIENCY, FRACTURES): VITD: 20.59 ng/mL — ABNORMAL LOW (ref 30.00–100.00)

## 2021-04-11 NOTE — Progress Notes (Signed)
Phone [phone]   Subjective:   Patient is a 26 y.o. female presenting for annual physical.    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 No pertinent surgical history.  History reviewed. No pertinent family history.  Medications- reviewed and updated Current Outpatient Medications  Medication Sig Dispense Refill   ESTARYLLA 0.25-35 MG-MCG tablet Take 1 tablet by mouth daily.     ipratropium (ATROVENT) 0.03 % nasal spray Place 2 sprays into both nostrils 2 (two) times daily.     traZODone (DESYREL) 150 MG tablet Take 1 tablet (150 mg total) by mouth at bedtime. 30 tablet 0   No current facility-administered medications for this visit.    Allergies-reviewed and updated No Known Allergies  Social History   Social History Narrative   Not on file   Objective  Objective:  BP 109/71    Pulse 79    Temp 97.9 F (36.6 C) (Temporal)    Ht 5\' 5"  (1.651 m)    Wt 182 lb 3.2 oz (82.6 kg)    SpO2 99%    BMI 30.32 kg/m  Gen: NAD, resting comfortably HEENT: Mucous membranes are moist. Oropharynx normal Neck: no thyromegaly CV: RRR no murmurs rubs or gallops Lungs: CTAB no crackles, wheeze, rhonchi Abdomen: soft/nontender/nondistended/normal bowel sounds. No rebound or guarding.  Ext: no edema Skin: warm, dry Neuro: grossly normal, moves all extremities, PERRLA   Assessment and Plan   Health Maintenance counseling: 1. Anticipatory guidance: Patient counseled regarding regular dental exams  q6 months, eye exams,  avoiding smoking and second hand smoke, limiting alcohol to 1 beverage per day, no illicit drugs.   2. Risk factor reduction:  Advised patient of need for regular exercise and diet rich with fruits and vegetables to reduce risk of heart attack and stroke. Wt Readings from Last 3 Encounters:  04/11/21 182 lb 3.2 oz (82.6 kg)  03/22/21 183 lb 9.6 oz (83.3 kg)  03/13/21 177 lb (80.3 kg)   3. Immunizations/screenings/ancillary studies Immunization History  Administered Date(s) Administered   PFIZER Comirnaty(Gray Top)Covid-19 Tri-Sucrose Vaccine 07/11/2019, 08/02/2019, 03/16/2020   Health Maintenance Due  Topic Date Due   HPV VACCINES (1 - 2-dose series) Never done   HIV Screening  Never done   Hepatitis C Screening  Never done   TETANUS/TDAP  Never done   PAP-Cervical Cytology Screening  Never done   PAP SMEAR-Modifier  Never done    4. Cervical cancer screening: 2022 5. Skin cancer screening- advised regular sunscreen use. Denies worrisome, changing, or new skin lesions.  6. Birth control/STD check: N/A 7. Smoking associated screening: non- smoker 8. Alcohol screening: N/A  Problem List Items Addressed This Visit       Other   Annual physical exam - Primary   Relevant Orders   Comprehensive metabolic panel   TSH   Lipid panel   CBC with Differential/Platelet   Vitamin D (25 hydroxy)    Recommended follow up:  Return  for as needed for future concerns. No future appointments.  Lab/Order associations: fasting   ICD-10-CM   1. Annual physical exam  Z00.00 Comprehensive metabolic panel    TSH    Lipid panel    CBC with Differential/Platelet    Vitamin D (25 hydroxy)        Dulce Sellar, NP

## 2021-04-15 ENCOUNTER — Telehealth: Payer: Self-pay | Admitting: Family

## 2021-04-15 ENCOUNTER — Ambulatory Visit: Payer: Managed Care, Other (non HMO) | Admitting: Sports Medicine

## 2021-04-15 ENCOUNTER — Other Ambulatory Visit: Payer: Self-pay

## 2021-04-15 VITALS — BP 120/80 | HR 82 | Ht 65.0 in | Wt 188.0 lb

## 2021-04-15 DIAGNOSIS — R519 Headache, unspecified: Secondary | ICD-10-CM

## 2021-04-15 NOTE — Telephone Encounter (Signed)
Returning a call that she missed. She also wanted to check on her labs.

## 2021-04-15 NOTE — Telephone Encounter (Signed)
Please see lab result note.

## 2021-04-15 NOTE — Progress Notes (Signed)
All labs are within normal range except for cholesterol. Total cholesterol number & LDL (bad #) are high. Must try to cut back on any junk food and fried foods and make therapeutic lifestyle changes to decrease alcohol, nonnutritional snacks e.g. chips/cookies,pies, cakes and candies and increase fruits/vegetables/fiber. Minimize fruit intake (still has sugar!) and avoid fried foods, fatty meat (red meat), high fat dairy foods, including cheese, milk, ice cream.  Continue/restart an exercise routine, shooting for 23min 5-7days per week.  Repeat fasting lipids (no food or drink except black coffee or water after midnight) in 3-6 months with an office visit.  Cholesterol goals: Total  <200; Triglycerides less than 150; HDL greater than 45 and LDL less than 100.
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
## 2021-04-15 NOTE — Patient Instructions (Addendum)
Good to see you  ?As needed follow up  ?
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
## 2021-05-29 ENCOUNTER — Ambulatory Visit: Payer: Managed Care, Other (non HMO) | Admitting: Sports Medicine

## 2021-05-29 ENCOUNTER — Other Ambulatory Visit: Payer: Self-pay

## 2021-05-29 VITALS — BP 118/80 | Ht 65.0 in | Wt 193.0 lb

## 2021-05-29 DIAGNOSIS — J3489 Other specified disorders of nose and nasal sinuses: Secondary | ICD-10-CM | POA: Diagnosis not present

## 2021-05-29 MED ORDER — AZITHROMYCIN 250 MG PO TABS: ORAL_TABLET | ORAL | 0 refills | Status: DC

## 2021-05-29 NOTE — Patient Instructions (Addendum)
Good to see you  Follow up with your primary care physician to discuss chronic sinus pressure Complete Z pak  As needed follow up

## 2021-07-01 ENCOUNTER — Encounter: Payer: Self-pay | Admitting: Family

## 2021-07-01 ENCOUNTER — Ambulatory Visit (INDEPENDENT_AMBULATORY_CARE_PROVIDER_SITE_OTHER): Payer: Managed Care, Other (non HMO) | Admitting: Family

## 2021-07-01 VITALS — BP 121/75 | HR 70 | Temp 97.9°F | Ht 65.0 in | Wt 183.1 lb

## 2021-07-01 DIAGNOSIS — B3731 Acute candidiasis of vulva and vagina: Secondary | ICD-10-CM

## 2021-07-01 DIAGNOSIS — F410 Panic disorder [episodic paroxysmal anxiety] without agoraphobia: Secondary | ICD-10-CM | POA: Diagnosis not present

## 2021-07-01 MED ORDER — HYDROXYZINE HCL 10 MG PO TABS: 10.0000 mg | ORAL_TABLET | Freq: Three times a day (TID) | ORAL | 0 refills | Status: DC | PRN

## 2021-07-01 MED ORDER — FLUCONAZOLE 150 MG PO TABS: 150.0000 mg | ORAL_TABLET | ORAL | 0 refills | Status: DC | PRN
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
## 2021-07-01 NOTE — Patient Instructions (Addendum)
It was very nice to see you today! ? ?I have sent generic Diflucan to your pharmacy for your yeast infection. ?I also have sent Hydroxyzine for you to take as needed for anxiety and/or panic attacks you may feel coming on. ? ? ?Vaginal Hygiene ?Healthy practices for vaginal hygiene: ?- Avoid pajamas. A robe / nightgown allows better air circulation. Sleep without panties  whenever possible. ?- Wear cotton or moisture wicking panties during the day. After washing the panties, rinse them twice to avoid irritating residues. Do not use fabric softeners for panties and / or swimsuits. ?- Don't keep a wet swimsuit on for a long time after swimming. ?- Avoid tights, leotard, leggings, tight pants or other tight attire. Loose skirts and pants allow air to circulate. ?- Avoid daily panty protector pads outside of monthly menstrual cycles.  ?It is beneficial to bathe with warm water every day: ?- Soak in clean water (without soap or bubble bath) for 10 to 15 minutes.  ?- Use soap to wash the other regions apart from the genital area before leaving the tub. Limit the use of soap in the genital areas. Use soaps without fragrance or are hypoallergenic. ?- Rinse the genital area and pat dry. A hair dryer can only be used if cold air is used. ?- Do NOT use bubble bath or fragrant soap. ?- Do NOT use vaginal sprays or talcum powder. These contain chemicals that irritate the skin. ?- If the genital area is sore or swollen, fragrance-free disposable wipes can be used instead of toilet paper. ?- Emollients, such as Vaseline may help protect the skin and may be applied to the irritated area. ?- Always remember to clean yourself from front to back after bowel movements. Pat dry after urinating.  ? ?

## 2021-07-01 NOTE — Assessment & Plan Note (Signed)
New - pt at a wedding this w/e and woke up in a panic, tingling down left arm, EMS called, pt reassured. Sending hydroxyzine to take as needed when feeling signs of an attack coming on. Advised on signs and use & SE of medication. ?

## 2021-07-01 NOTE — Assessment & Plan Note (Deleted)
New - pt at a wedding this w/e and woke up in a panic, tingling down left arm, EMS called, pt reassured. Sending hydroxyzine to take as needed when feeling signs of an attack coming on. Advised on signs and use & SE of medication. ?

## 2021-07-11 ENCOUNTER — Other Ambulatory Visit (HOSPITAL_COMMUNITY)
Admission: RE | Admit: 2021-07-11 | Discharge: 2021-07-11 | Disposition: A | Discharge status: Home / Self Care | Payer: Managed Care, Other (non HMO) | Source: Ambulatory Visit | Attending: Family | Admitting: Family

## 2021-07-11 ENCOUNTER — Ambulatory Visit (INDEPENDENT_AMBULATORY_CARE_PROVIDER_SITE_OTHER): Payer: Managed Care, Other (non HMO) | Admitting: Family

## 2021-07-11 ENCOUNTER — Encounter: Payer: Self-pay | Admitting: Family

## 2021-07-11 VITALS — BP 115/72 | HR 68 | Temp 98.7°F | Ht 65.0 in | Wt 179.5 lb

## 2021-07-11 DIAGNOSIS — B3731 Acute candidiasis of vulva and vagina: Secondary | ICD-10-CM

## 2021-07-11 DIAGNOSIS — Z113 Encounter for screening for infections with a predominantly sexual mode of transmission: Secondary | ICD-10-CM | POA: Insufficient documentation

## 2021-07-11 NOTE — Progress Notes (Signed)
? ?  Subjective:  ? ? ? Patient ID: Susan Barry, female    DOB: 1995/09/18, 26 y.o.   MRN: [id number] ? ?Chief Complaint  ?Patient presents with  ? Vaginitis  ?  Pt c/o still having an itch.   ? ?HPI: ?Vaginitis: Patient complains of an abnormal vaginal discharge for: no d/c ?Vaginal symptoms include  itching, pain ?STI Risk/HX: will check today ?Discharge described as: none ?Other associated symptoms: none ?Menstrual pattern: finishing her cycle ?Contraception: OCPs  ? ?Health Maintenance Due  ?Topic Date Due  ? HPV VACCINES (1 - 2-dose series) Never done  ? HIV Screening  Never done  ? Hepatitis C Screening  Never done  ? TETANUS/TDAP  Never done  ? PAP-Cervical Cytology Screening  Never done  ? PAP SMEAR-Modifier  Never done  ? COVID-19 Vaccine (4 - Booster for Pfizer series) 05/11/2020  ? ? ?Past Medical History:  ?Diagnosis Date  ? Annual physical exam 04/11/2021  ? Concussion   ? Ovarian cyst   ? Persistent cough for 3 weeks or longer 03/22/2021  ? ? ?History reviewed. No pertinent surgical history. ? ?Outpatient Medications Prior to Visit  ?Medication Sig Dispense Refill  ? ESTARYLLA 0.25-35 MG-MCG tablet Take 1 tablet by mouth daily.    ? fluconazole (DIFLUCAN) 150 MG tablet Take 1 tablet (150 mg total) by mouth every three (3) days as needed. (Patient not taking: Reported on 07/11/2021) 2 tablet 0  ? hydrOXYzine (ATARAX) 10 MG tablet Take 1 tablet (10 mg total) by mouth 3 (three) times daily as needed. (Patient not taking: Reported on 07/11/2021) 30 tablet 0  ? ?No facility-administered medications prior to visit.  ? ? ?No Known Allergies ? ? ?   ?Objective:  ?  ?Physical Exam ?Vitals and nursing note reviewed.  ?Constitutional:   ?   Appearance: Normal appearance.  ?Cardiovascular:  ?   Rate and Rhythm: Normal rate and regular rhythm.  ?Pulmonary:  ?   Effort: Pulmonary effort is normal.  ?   Breath sounds: Normal breath sounds.  ?Musculoskeletal:     ?   General: Normal range of motion.  ?Skin: ?   General:  Skin is warm and dry.  ?Neurological:  ?   Mental Status: She is alert.  ?Psychiatric:     ?   Mood and Affect: Mood normal.     ?   Behavior: Behavior normal.  ? ? ?BP 115/72 (BP Location: Left Arm, Patient Position: Sitting, Cuff Size: Large)   Pulse 68   Temp 98.7 ?F (37.1 ?C) (Temporal)   Ht 5\' 5"  (1.651 m)   Wt 179 lb 8 oz (81.4 kg)   LMP 07/05/2021 (Exact Date)   SpO2 99%   BMI 29.87 kg/m?  ?Wt Readings from Last 3 Encounters:  ?07/11/21 179 lb 8 oz (81.4 kg)  ?07/01/21 183 lb 2 oz (83.1 kg)  ?05/29/21 193 lb (87.5 kg)  ? ? ?   ?Assessment & Plan:  ? ?Problem List Items Addressed This Visit   ? ?  ? Genitourinary  ? Vaginal yeast infection - Primary  ? pt wants to test for cure, having itching still, but also finishing her menses. Advised on changing pads often during cycle. rechecking yeast today. ? ?Relevant Orders  ? Urine cytology ancillary only  ? ? ?No orders of the defined types were placed in this encounter. ? ? ?Jeanie Sewer, NP ? ?

## 2021-07-12 LAB — URINE CYTOLOGY ANCILLARY ONLY
Bacterial Vaginitis-Urine: NEGATIVE
Candida Urine: NEGATIVE
Chlamydia: NEGATIVE
Comment: NEGATIVE
Comment: NEGATIVE
Comment: NORMAL
Neisseria Gonorrhea: NEGATIVE
Trichomonas: NEGATIVE

## 2021-07-14 NOTE — Progress Notes (Signed)
Susan Barry, ? ?Looks like your yeast infection is all clear. Remember to stay as dry as possible, and avoid wearing tight fitting underwear or pants, or change often if moist,  to avoid future infections. ?

## 2021-07-15 ENCOUNTER — Ambulatory Visit: Payer: Managed Care, Other (non HMO) | Admitting: Family

## 2021-07-26 ENCOUNTER — Encounter: Payer: Self-pay | Admitting: Family

## 2021-07-26 ENCOUNTER — Ambulatory Visit: Payer: Managed Care, Other (non HMO) | Admitting: Family

## 2021-07-26 ENCOUNTER — Other Ambulatory Visit (HOSPITAL_COMMUNITY)
Admission: RE | Admit: 2021-07-26 | Discharge: 2021-07-26 | Disposition: A | Discharge status: Home / Self Care | Payer: Managed Care, Other (non HMO) | Source: Ambulatory Visit | Attending: Family | Admitting: Family

## 2021-07-26 VITALS — BP 116/68 | HR 72 | Temp 98.5°F | Wt 184.1 lb

## 2021-07-26 DIAGNOSIS — N761 Subacute and chronic vaginitis: Secondary | ICD-10-CM | POA: Diagnosis present

## 2021-07-26 DIAGNOSIS — R102 Pelvic and perineal pain unspecified side: Secondary | ICD-10-CM

## 2021-07-26 DIAGNOSIS — Z113 Encounter for screening for infections with a predominantly sexual mode of transmission: Secondary | ICD-10-CM | POA: Insufficient documentation

## 2021-07-26 LAB — POCT URINALYSIS DIPSTICK
Bilirubin, UA: NEGATIVE
Glucose, UA: NEGATIVE
Ketones, UA: NEGATIVE
Leukocytes, UA: NEGATIVE
Nitrite, UA: NEGATIVE
Protein, UA: NEGATIVE
Spec Grav, UA: 1.015 (ref 1.010–1.025)
Urobilinogen, UA: 0.2 E.U./dL
pH, UA: 6.5 (ref 5.0–8.0)

## 2021-07-26 NOTE — Progress Notes (Signed)
Lacie Draft, ? ?Your urine sample is negative for any infection. Glad to rule that out! ?Will let you know about the swab results next week. ?Remember to call you Gynecology office to try and get your appointment moved up, letting them know you are having new pelvic pain. ?If related to an ovarian cyst, as we discussed, the pain should resolve as you start your menstrual cycle, but if not keep your gynecology appt. ? ?Have a great weekend :-)

## 2021-07-26 NOTE — Progress Notes (Signed)
? ?Subjective:  ? ? ? Patient ID: Susan Barry, female    DOB: Sep 03, 1995, 26 y.o.   MRN: [id number] ? ?Chief Complaint  ?Patient presents with  ? Vaginitis  ?  Pt c/o discomfort in the vaginal area. Lower abdomen pain  ? ?HPI: ?Vaginitis: Patient complains of abnormal vaginal pain. ?Vaginal symptoms include pain, denies itching, odor, or irritation. ?STI Risk/HX: denies risk ?Discharge described as: denies any discharge ?Other associated symptoms: pelvic pain ?Menstrual pattern: regular, due to start in 1 week. ?Contraception: OCP  ? ?Health Maintenance Due  ?Topic Date Due  ? HPV VACCINES (1 - 2-dose series) Never done  ? HIV Screening  Never done  ? Hepatitis C Screening  Never done  ? TETANUS/TDAP  Never done  ? PAP-Cervical Cytology Screening  Never done  ? PAP SMEAR-Modifier  Never done  ? COVID-19 Vaccine (4 - Booster for Pfizer series) 05/11/2020  ? ? ?Past Medical History:  ?Diagnosis Date  ? Annual physical exam 04/11/2021  ? Concussion   ? Ovarian cyst   ? Persistent cough for 3 weeks or longer 03/22/2021  ? ? ?History reviewed. No pertinent surgical history. ? ?Outpatient Medications Prior to Visit  ?Medication Sig Dispense Refill  ? ESTARYLLA 0.25-35 MG-MCG tablet Take 1 tablet by mouth daily.    ? hydrOXYzine (ATARAX) 10 MG tablet Take 1 tablet (10 mg total) by mouth 3 (three) times daily as needed. 30 tablet 0  ? fluconazole (DIFLUCAN) 150 MG tablet Take 1 tablet (150 mg total) by mouth every three (3) days as needed. (Patient not taking: Reported on 07/26/2021) 2 tablet 0  ? ?No facility-administered medications prior to visit.  ? ? ?No Known Allergies ? ? ?   ?Objective:  ?  ?Physical Exam ?Vitals and nursing note reviewed.  ?Constitutional:   ?   Appearance: Normal appearance.  ?Cardiovascular:  ?   Rate and Rhythm: Normal rate and regular rhythm.  ?Pulmonary:  ?   Effort: Pulmonary effort is normal.  ?   Breath sounds: Normal breath sounds.  ?Genitourinary: ?   Labia:     ?   Right: No rash,  tenderness or lesion.     ?   Left: No rash, tenderness or lesion.   ?   Vagina: Vaginal discharge present. No erythema, tenderness or bleeding.  ?Musculoskeletal:     ?   General: Normal range of motion.  ?Skin: ?   General: Skin is warm and dry.  ?Neurological:  ?   Mental Status: She is alert.  ?Psychiatric:     ?   Mood and Affect: Mood normal.     ?   Behavior: Behavior normal.  ? ? ?BP 116/68 (BP Location: Left Arm, Patient Position: Sitting, Cuff Size: Large)   Pulse 72   Temp 98.5 ?F (36.9 ?C) (Temporal)   Wt 184 lb 2 oz (83.5 kg)   LMP 07/05/2021 (Exact Date) Comment: Pill  SpO2 100%   BMI 30.64 kg/m?  ?Wt Readings from Last 3 Encounters:  ?07/26/21 184 lb 2 oz (83.5 kg)  ?07/11/21 179 lb 8 oz (81.4 kg)  ?07/01/21 183 lb 2 oz (83.1 kg)  ? ?   ?Assessment & Plan:  ? ?Problem List Items Addressed This Visit   ?None ?Visit Diagnoses   ? ? Pelvic pain    -  Primary  ? reports just pelvic pain today, no vaginal discharge, odor, itching or irritation. pt starts her menses in about 1 week, she states feels  somewhat like an ovarian cyst, but unsure as has been long time since she had one. Advised pt this is normally the timeframe to feel ovarian pain. Advised taking up to 600mg  Ibuprofen tid for next few days. Will run UA and swab to rule other causes. ? ?Relevant Orders  ? POCT Urinalysis Dipstick  ? Cervicovaginal ancillary only  ? ?  ? ?Jeanie Sewer, NP ? ?

## 2021-07-26 NOTE — Patient Instructions (Signed)
It was very nice to see you today! ? ?Go to the lab and leave a urine specimen for testing. I will send a message via MyChart with the results. ?I am sending your swab off for testing, this can take a few days. ? ? ?PLEASE NOTE: ? ?If you had any lab tests please let us know if you have not heard back within a few days. You may see your results on MyChart before we have a chance to review them but we will give you a call once they are reviewed by [place]. If we ordered any referrals today, please let us know if you have not heard from their office within the next week.  ? ?Please try these tips to maintain a healthy lifestyle: ? ?Eat most of your calories during the day when you are active. Eliminate processed foods including packaged sweets (pies, cakes, cookies), reduce intake of potatoes, white bread, white pasta, and white rice. Look for whole grain options, oat flour or almond flour. ? ?Each meal should contain half fruits/vegetables, one quarter protein, and one quarter carbs (no bigger than a computer mouse). ? ?Cut down on sweet beverages. This includes juice, soda, and sweet tea. Also watch fruit intake, though this is a healthier sweet option, it still contains natural sugar! Limit to 3 servings daily. ? ?Drink at least 1 glass of water with each meal and aim for at least 8 glasses per day ? ?Exercise at least 150 minutes every week.  ? ?

## 2021-07-29 LAB — CERVICOVAGINAL ANCILLARY ONLY
Bacterial Vaginitis (gardnerella): NEGATIVE
Candida Glabrata: NEGATIVE
Candida Vaginitis: NEGATIVE
Chlamydia: NEGATIVE
Comment: NEGATIVE
Comment: NEGATIVE
Comment: NEGATIVE
Comment: NEGATIVE
Comment: NEGATIVE
Comment: NORMAL
Neisseria Gonorrhea: NEGATIVE
Trichomonas: NEGATIVE

## 2021-07-30 NOTE — Progress Notes (Signed)
Hi Susan Barry, ? ?Your swab was also negative for yeast, BV & any STDs. ? ?

## 2021-08-22 ENCOUNTER — Ambulatory Visit (INDEPENDENT_AMBULATORY_CARE_PROVIDER_SITE_OTHER): Payer: Managed Care, Other (non HMO) | Admitting: Family

## 2021-08-22 ENCOUNTER — Encounter: Payer: Self-pay | Admitting: Family

## 2021-08-22 VITALS — BP 119/84 | HR 76 | Temp 98.3°F | Ht 65.0 in | Wt 182.2 lb

## 2021-08-22 DIAGNOSIS — R519 Headache, unspecified: Secondary | ICD-10-CM | POA: Diagnosis not present

## 2021-08-22 MED ORDER — METHOCARBAMOL 500 MG PO TABS: 500.0000 mg | ORAL_TABLET | Freq: Three times a day (TID) | ORAL | 0 refills | Status: DC | PRN

## 2021-08-22 MED ORDER — KETOROLAC TROMETHAMINE 60 MG/2ML IM SOLN
60.0000 mg | Freq: Once | INTRAMUSCULAR | Status: AC
  Administered 2021-08-22: 60 mg via INTRAMUSCULAR

## 2021-08-22 NOTE — Progress Notes (Signed)
Subjective:     Patient ID: Susan Barry, female    DOB: 1996-03-21, 26 y.o.   MRN: [id number]  Chief Complaint  Patient presents with   Headache    Pt c/o pressure in her head and nausea for the past few day. Has tried ibuprofen which did not help.    HPI: Headache:  pt reports persistent HA for 4 days, has taken a lot of Ibuprofen and can't get rid of it, states it is not as bad as a migraine she has had before, but makes it hard for her to work. Reports mild nausea, one vomiting episode yesterday.   Assessment & Plan:   Problem List Items Addressed This Visit   None Visit Diagnoses     Persistent headaches    -  Primary   Giving Toradol shot in office, and sending Robaxin, advised on use and side effects for both meds.  Advised on avoiding typical headache triggers, including avoiding stress, staying hydrated, not skipping meals, getting 7 to 8 hours of sleep, and avoiding too much caffeine.  Follow-up as needed.  Relevant Medications   ketorolac (TORADOL) injection 60 mg (Start on 08/22/2021  8:30 AM)   methocarbamol (ROBAXIN) 500 MG tablet      Outpatient Medications Prior to Visit  Medication Sig Dispense Refill   ESTARYLLA 0.25-35 MG-MCG tablet Take 1 tablet by mouth daily.     hydrOXYzine (ATARAX) 10 MG tablet Take 1 tablet (10 mg total) by mouth 3 (three) times daily as needed. 30 tablet 0   fluconazole (DIFLUCAN) 150 MG tablet Take 1 tablet (150 mg total) by mouth every three (3) days as needed. 2 tablet 0   No facility-administered medications prior to visit.    Past Medical History:  Diagnosis Date   Annual physical exam 04/11/2021   Concussion    Ovarian cyst    Persistent cough for 3 weeks or longer 03/22/2021    No past surgical history on file.  No Known Allergies     Objective:    Physical Exam Vitals and nursing note reviewed.  Constitutional:      Appearance: Normal appearance.  Cardiovascular:     Rate and Rhythm: Normal rate and  regular rhythm.  Pulmonary:     Effort: Pulmonary effort is normal.     Breath sounds: Normal breath sounds.  Musculoskeletal:        General: Normal range of motion.  Skin:    General: Skin is warm and dry.  Neurological:     Mental Status: She is alert.  Psychiatric:        Mood and Affect: Mood normal.        Behavior: Behavior normal.    BP 119/84 (BP Location: Left Arm, Patient Position: Sitting, Cuff Size: Large)   Pulse 76   Temp 98.3 F (36.8 C) (Temporal)   Ht 5\' 5"  (1.651 m)   Wt 182 lb 4 oz (82.7 kg)   LMP 07/31/2021 (Exact Date)   SpO2 98%   BMI 30.33 kg/m  Wt Readings from Last 3 Encounters:  08/22/21 182 lb 4 oz (82.7 kg)  07/26/21 184 lb 2 oz (83.5 kg)  07/11/21 179 lb 8 oz (81.4 kg)        Meds ordered this encounter  Medications   ketorolac (TORADOL) injection 60 mg   methocarbamol (ROBAXIN) 500 MG tablet    Sig: Take 1-2 tablets (500-1,000 mg total) by mouth every 8 (eight) hours as needed for muscle spasms (  Headache or migraine).    Dispense:  40 tablet    Refill:  0    Order Specific Question:   Supervising Provider    Answer:   ANDY, CAMILLE L [[id number]]    Dulce Sellar, NP

## 2021-08-23 ENCOUNTER — Ambulatory Visit: Payer: Managed Care, Other (non HMO) | Admitting: Family

## 2021-09-04 ENCOUNTER — Ambulatory Visit: Payer: Managed Care, Other (non HMO) | Admitting: Family

## 2021-09-04 VITALS — BP 119/77 | HR 67 | Temp 98.1°F | Resp 16 | Ht 65.0 in | Wt 182.2 lb

## 2021-09-04 DIAGNOSIS — R519 Headache, unspecified: Secondary | ICD-10-CM | POA: Diagnosis not present

## 2021-09-04 MED ORDER — SUMATRIPTAN SUCCINATE 50 MG PO TABS: 50.0000 mg | ORAL_TABLET | ORAL | 1 refills | Status: DC

## 2021-09-04 MED ORDER — METOPROLOL SUCCINATE ER 25 MG PO TB24: 25.0000 mg | ORAL_TABLET | Freq: Every evening | ORAL | 1 refills | Status: DC

## 2021-09-04 NOTE — Patient Instructions (Addendum)
It was very nice to see you today!  I'm sending Metoprolol to take at bedtime, if it causes dizziness after waking for several days that doesn't resolve, ok to cut pill in half. Also sending Imitrex to take at first sign of headache with 1 or 2 Aleve (Naproxen). You can repeat the Imitrex dose only after 2 hours, up to 4 pills max in 24 hours. Can retake the Aleve in 8-10 hours.  Follow up in 1 month or sooner if needed.    PLEASE NOTE:  If you had any lab tests please let us know if you have not heard back within a few days. You may see your results on MyChart before we have a chance to review them but we will give you a call once they are reviewed by [place]. If we ordered any referrals today, please let us know if you have not heard from their office within the next week.

## 2021-09-04 NOTE — Assessment & Plan Note (Signed)
Unstable - seen 2 weeks ago, Toradol shot nor Robaxin helping, only Ibuprofen helps, but taking too many qd, creating rebound HA. Sending Metoprolol for prevention qhs and Imitrex for acute advised on both meds use & SE, f/u in 1 month.

## 2021-09-04 NOTE — Progress Notes (Signed)
Subjective:     Patient ID: Susan Barry, female    DOB: 06/27/1995, 26 y.o.   MRN: [id number]  Chief Complaint  Patient presents with   Headache    Here for continued headaches onset 2 weeks    HPI: Headache:  pt reports persistent HA for over 2 weeks, given Toradol shot last visit and Robaxin. has taken a lot of Ibuprofen and can't get rid of it, states it is not as bad as a migraine she has had before, but makes it hard for her to work.    Assessment & Plan:   Problem List Items Addressed This Visit       Other   Persistent headaches - Primary    Unstable - seen 2 weeks ago, Toradol shot nor Robaxin helping, only Ibuprofen helps, but taking too many qd, creating rebound HA. Sending Metoprolol for prevention qhs and Imitrex for acute advised on both meds use & SE, f/u in 1 month.       Relevant Medications   SUMAtriptan (IMITREX) 50 MG tablet   metoprolol succinate (TOPROL-XL) 25 MG 24 hr tablet    Outpatient Medications Prior to Visit  Medication Sig Dispense Refill   ESTARYLLA 0.25-35 MG-MCG tablet Take 1 tablet by mouth daily.     hydrOXYzine (ATARAX) 10 MG tablet Take 1 tablet (10 mg total) by mouth 3 (three) times daily as needed. 30 tablet 0   methocarbamol (ROBAXIN) 500 MG tablet Take 1-2 tablets (500-1,000 mg total) by mouth every 8 (eight) hours as needed for muscle spasms (Headache or migraine). 40 tablet 0   No facility-administered medications prior to visit.    Past Medical History:  Diagnosis Date   Annual physical exam 04/11/2021   Concussion    Ovarian cyst    Persistent cough for 3 weeks or longer 03/22/2021    No past surgical history on file.  No Known Allergies     Objective:    Physical Exam Vitals and nursing note reviewed.  Constitutional:      Appearance: Normal appearance.  Cardiovascular:     Rate and Rhythm: Normal rate and regular rhythm.  Pulmonary:     Effort: Pulmonary effort is normal.     Breath sounds: Normal  breath sounds.  Musculoskeletal:        General: Normal range of motion.  Skin:    General: Skin is warm and dry.  Neurological:     Mental Status: She is alert.  Psychiatric:        Mood and Affect: Mood normal.        Behavior: Behavior normal.    BP 119/77 (BP Location: Right Arm, Patient Position: Sitting, Cuff Size: Normal)   Pulse 67   Temp 98.1 F (36.7 C) (Oral)   Resp 16   Ht 5\' 5"  (1.651 m)   Wt 182 lb 3.2 oz (82.6 kg)   SpO2 100%   BMI 30.32 kg/m  Wt Readings from Last 3 Encounters:  09/04/21 182 lb 3.2 oz (82.6 kg)  08/22/21 182 lb 4 oz (82.7 kg)  07/26/21 184 lb 2 oz (83.5 kg)        Meds ordered this encounter  Medications   SUMAtriptan (IMITREX) 50 MG tablet    Sig: Take 1 tablet (50 mg total) by mouth as directed. May repeat in 2 hours if headache persists or recurs, max 24 hour dose = 4 pills.    Dispense:  20 tablet    Refill:  1  Order Specific Question:   Supervising Provider    Answer:   ANDY, CAMILLE L [[id number]]   metoprolol succinate (TOPROL-XL) 25 MG 24 hr tablet    Sig: Take 1 tablet (25 mg total) by mouth at bedtime.    Dispense:  30 tablet    Refill:  1    Order Specific Question:   Supervising Provider    Answer:   ANDY, CAMILLE L [id number]    Jeanie Sewer, NP

## 2021-09-09 ENCOUNTER — Other Ambulatory Visit (HOSPITAL_BASED_OUTPATIENT_CLINIC_OR_DEPARTMENT_OTHER): Payer: Self-pay | Admitting: Chiropractic Medicine

## 2021-09-09 ENCOUNTER — Ambulatory Visit (HOSPITAL_BASED_OUTPATIENT_CLINIC_OR_DEPARTMENT_OTHER)
Admission: RE | Admit: 2021-09-09 | Discharge: 2021-09-09 | Disposition: A | Discharge status: Home / Self Care | Payer: Managed Care, Other (non HMO) | Source: Ambulatory Visit | Attending: Chiropractic Medicine | Admitting: Chiropractic Medicine

## 2021-09-09 DIAGNOSIS — T1490XA Injury, unspecified, initial encounter: Secondary | ICD-10-CM

## 2021-10-02 ENCOUNTER — Ambulatory Visit: Payer: Managed Care, Other (non HMO) | Admitting: Family

## 2021-10-04 ENCOUNTER — Ambulatory Visit: Payer: Managed Care, Other (non HMO) | Admitting: Family

## 2021-10-04 ENCOUNTER — Encounter: Payer: Self-pay | Admitting: Family

## 2021-10-04 VITALS — BP 110/84 | HR 70 | Temp 98.3°F | Ht 65.0 in | Wt 184.1 lb

## 2021-10-04 DIAGNOSIS — R519 Headache, unspecified: Secondary | ICD-10-CM

## 2021-10-04 DIAGNOSIS — Z23 Encounter for immunization: Secondary | ICD-10-CM

## 2021-10-04 MED ORDER — SUMATRIPTAN SUCCINATE 50 MG PO TABS: 50.0000 mg | ORAL_TABLET | ORAL | 1 refills | Status: DC

## 2021-10-04 NOTE — Progress Notes (Signed)
Subjective:     Patient ID: Susan Barry, female    DOB: 04-21-1995, 26 y.o.   MRN: [id number]  Chief Complaint  Patient presents with   Follow-up    Pt states her headaches did go away but then last Wednesday 6/21 they came back and are consistent.     HPI: Headache:  pt reports persistent HA for over 2 weeks, given Toradol shot last visit and Robaxin. has taken a lot of Ibuprofen and can't get rid of it, states it is not as bad as a migraine she has had before, but makes it hard for her to work. She took the Metoprolol which helped but then stopped taking and headches returned. Did not get Imitrex d/t insurance problem, and never needed the Robaxin.   Assessment & Plan:   Problem List Items Addressed This Visit       Other   Persistent headaches - Primary    Reports taking the Metoprolol but stopped when headaches stopped. Advised again that this med is the preventative and to take nightly. Advised to call her pharmacy and ask about why insurance is not covering the Imitrex, we have no way of knowing on our end. f/u prn.      Relevant Medications   SUMAtriptan (IMITREX) 50 MG tablet   Other Visit Diagnoses     Need for prophylactic vaccination with combined diphtheria-tetanus-pertussis (DTP) vaccine       Relevant Orders   Tdap vaccine greater than or equal to 27yo IM (Completed)       Outpatient Medications Prior to Visit  Medication Sig Dispense Refill   ESTARYLLA 0.25-35 MG-MCG tablet Take 1 tablet by mouth daily.     hydrOXYzine (ATARAX) 10 MG tablet Take 1 tablet (10 mg total) by mouth 3 (three) times daily as needed. 30 tablet 0   methocarbamol (ROBAXIN) 500 MG tablet Take 1-2 tablets (500-1,000 mg total) by mouth every 8 (eight) hours as needed for muscle spasms (Headache or migraine). 40 tablet 0   metoprolol succinate (TOPROL-XL) 25 MG 24 hr tablet Take 1 tablet (25 mg total) by mouth at bedtime. 30 tablet 1   SUMAtriptan (IMITREX) 50 MG tablet Take 1  tablet (50 mg total) by mouth as directed. May repeat in 2 hours if headache persists or recurs, max 24 hour dose = 4 pills. 20 tablet 1   No facility-administered medications prior to visit.    Past Medical History:  Diagnosis Date   Annual physical exam 04/11/2021   Concussion    Ovarian cyst    Persistent cough for 3 weeks or longer 03/22/2021    No past surgical history on file.  No Known Allergies     Objective:    Physical Exam Vitals and nursing note reviewed.  Constitutional:      Appearance: Normal appearance.  Cardiovascular:     Rate and Rhythm: Normal rate and regular rhythm.  Pulmonary:     Effort: Pulmonary effort is normal.     Breath sounds: Normal breath sounds.  Musculoskeletal:        General: Normal range of motion.  Skin:    General: Skin is warm and dry.  Neurological:     Mental Status: She is alert.  Psychiatric:        Mood and Affect: Mood normal.        Behavior: Behavior normal.    BP 110/84 (BP Location: Left Arm, Patient Position: Sitting, Cuff Size: Large)   Pulse 70  Temp 98.3 F (36.8 C) (Temporal)   Ht 5\' 5"  (1.651 m)   Wt 184 lb 2 oz (83.5 kg)   LMP 09/25/2021 (Exact Date)   SpO2 98%   BMI 30.64 kg/m  Wt Readings from Last 3 Encounters:  10/04/21 184 lb 2 oz (83.5 kg)  09/04/21 182 lb 3.2 oz (82.6 kg)  08/22/21 182 lb 4 oz (82.7 kg)    Meds ordered this encounter  Medications   SUMAtriptan (IMITREX) 50 MG tablet    Sig: Take 1 tablet (50 mg total) by mouth as directed. May repeat in 2 hours if headache persists or recurs, max 24 hour dose = 4 pills.    Dispense:  20 tablet    Refill:  1    Order Specific Question:   Supervising Provider    Answer:   ANDY, CAMILLE L [[id number]]    08/24/21, NP

## 2021-10-04 NOTE — Assessment & Plan Note (Signed)
Reports taking the Metoprolol but stopped when headaches stopped. Advised again that this med is the preventative and to take nightly. Advised to call her pharmacy and ask about why insurance is not covering the Imitrex, we have no way of knowing on our end. f/u prn.

## 2021-10-07 ENCOUNTER — Telehealth: Payer: Self-pay | Admitting: Family

## 2021-10-07 NOTE — Telephone Encounter (Signed)
Pt states according to pharmacy, insurance will only pay for 9 pills.   Pt states she will need more than that.   Pt states pharmacy has "reached out to PCP" but not heard back. FO Rep explained that from the 06/30 appointment is not enough time for our office to responed.  Pt request the PCP team to reach out to the patient or pharmacy or both as needed to find a solution.

## 2021-10-11 NOTE — Telephone Encounter (Signed)
I called pt and let her know that she may need PA next month according to pharmacy. Pt is only able to pick up 9 pills due to insurance or she may pay out of pocket for the rest of the quantity. Pt gave a verbal understanding.

## 2021-11-11 ENCOUNTER — Ambulatory Visit (INDEPENDENT_AMBULATORY_CARE_PROVIDER_SITE_OTHER): Payer: Managed Care, Other (non HMO) | Admitting: Family

## 2021-11-11 ENCOUNTER — Encounter: Payer: Self-pay | Admitting: Family

## 2021-11-11 VITALS — BP 115/70 | HR 71 | Temp 98.3°F | Ht 65.0 in | Wt 187.1 lb

## 2021-11-11 DIAGNOSIS — H9201 Otalgia, right ear: Secondary | ICD-10-CM

## 2021-11-11 NOTE — Progress Notes (Signed)
Patient ID: Susan Barry, female    DOB: [birth year], 26 y.o.   MRN: [id number]  Chief Complaint  Patient presents with   Ear Pain    Pt c/o pain in both ears but right is worse than left. Noticed yesterday on 11/10/2021. Has trie ibuprofen which does not help at all.     HPI: Ear pain - pt describes a 1 days history of Right ear pain with no fever, no pressure and fullness, no drainage. Denies associated dizziness.  Denies any recent sinus sx, has not been swimming in lake, pool, or ocean for last few weeks.   Assessment & Plan:  1. Right ear pain reports mostly on outer ear, hurts when tugging her ear. TM is retracted, scarred, no erythema or drainage noted. Advised to monitor, take Tylenol or Ibuprofen for pain as needed, let me know if not resolving.    Subjective:    Outpatient Medications Prior to Visit  Medication Sig Dispense Refill   ESTARYLLA 0.25-35 MG-MCG tablet Take 1 tablet by mouth daily.     hydrOXYzine (ATARAX) 10 MG tablet Take 1 tablet (10 mg total) by mouth 3 (three) times daily as needed. 30 tablet 0   methocarbamol (ROBAXIN) 500 MG tablet Take 1-2 tablets (500-1,000 mg total) by mouth every 8 (eight) hours as needed for muscle spasms (Headache or migraine). 40 tablet 0   metoprolol succinate (TOPROL-XL) 25 MG 24 hr tablet Take 1 tablet (25 mg total) by mouth at bedtime. 30 tablet 1   SUMAtriptan (IMITREX) 50 MG tablet Take 1 tablet (50 mg total) by mouth as directed. May repeat in 2 hours if headache persists or recurs, max 24 hour dose = 4 pills. 20 tablet 1   No facility-administered medications prior to visit.   Past Medical History:  Diagnosis Date   Annual physical exam 04/11/2021   Concussion    Ovarian cyst    Persistent cough for 3 weeks or longer 03/22/2021   History reviewed. No pertinent surgical history. No Known Allergies    Objective:    Physical Exam Vitals and nursing note reviewed.  Constitutional:      Appearance: Normal  appearance.  HENT:     Right Ear: Ear canal and external ear normal. No decreased hearing noted. Tenderness present. No swelling. No middle ear effusion. There is no impacted cerumen. Tympanic membrane is scarred and retracted. Tympanic membrane is not erythematous.     Left Ear: Ear canal and external ear normal. No decreased hearing noted. No swelling or tenderness.  No middle ear effusion. There is no impacted cerumen. Tympanic membrane is scarred and retracted. Tympanic membrane is not erythematous.  Cardiovascular:     Rate and Rhythm: Normal rate and regular rhythm.  Pulmonary:     Effort: Pulmonary effort is normal.     Breath sounds: Normal breath sounds.  Musculoskeletal:        General: Normal range of motion.  Skin:    General: Skin is warm and dry.  Neurological:     Mental Status: She is alert.  Psychiatric:        Mood and Affect: Mood normal.        Behavior: Behavior normal.    BP 115/70 (BP Location: Left Arm, Patient Position: Sitting, Cuff Size: Large)   Pulse 71   Temp 98.3 F (36.8 C) (Temporal)   Ht 5\' 5"  (1.651 m)   Wt 187 lb 2 oz (84.9 kg)   LMP 10/23/2021 (Exact Date)  SpO2 97%   BMI 31.14 kg/m  Wt Readings from Last 3 Encounters:  11/11/21 187 lb 2 oz (84.9 kg)  10/04/21 184 lb 2 oz (83.5 kg)  09/04/21 182 lb 3.2 oz (82.6 kg)       Dulce Sellar, NP

## 2021-12-02 ENCOUNTER — Ambulatory Visit: Payer: Managed Care, Other (non HMO) | Admitting: Family

## 2021-12-04 ENCOUNTER — Ambulatory Visit: Payer: Managed Care, Other (non HMO) | Admitting: Family

## 2021-12-04 ENCOUNTER — Encounter: Payer: Self-pay | Admitting: Family

## 2021-12-04 VITALS — BP 121/68 | HR 65 | Temp 98.5°F | Ht 65.0 in | Wt 181.4 lb

## 2021-12-04 DIAGNOSIS — H61301 Acquired stenosis of right external ear canal, unspecified: Secondary | ICD-10-CM | POA: Diagnosis not present

## 2021-12-04 MED ORDER — HYDROCORTISONE-ACETIC ACID 1-2 % OT SOLN: 4.0000 [drp] | Freq: Two times a day (BID) | OTIC | 0 refills | Status: AC

## 2021-12-04 NOTE — Progress Notes (Signed)
Patient ID: Susan Barry, female    DOB: [birth year], 26 y.o.   MRN: [id number]  Chief Complaint  Patient presents with   Ear Pain    Pt c/o bilateral ear pain, fullness and achy feeling. Ear pain varies between each ear. Pt states today her left ear is bothering her but the right isn't. Pt has tried ibuprofen which did help some.     HPI: Ear pain - pt describes a 3 day history of Bilateral ear pain with no fever, no drainage. Denies associated fullness, hearing loss, discharge, itching, or dizziness. Denies any other sinus sx, has not been swimming in pool, lake or ocean.   Assessment & Plan:  1. 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 Call back to office if pain not resolving.  - acetic acid-hydrocortisone (VOSOL-HC) OTIC solution; Place 4 drops into the right ear 2 (two) times daily for 7 days. Apply to affected ear. For ear canal irritation/otitis externa.  Dispense: 10 mL; Refill: 0    Subjective:    Outpatient Medications Prior to Visit  Medication Sig Dispense Refill   ESTARYLLA 0.25-35 MG-MCG tablet Take 1 tablet by mouth daily.     hydrOXYzine (ATARAX) 10 MG tablet Take 1 tablet (10 mg total) by mouth 3 (three) times daily as needed. 30 tablet 0   methocarbamol (ROBAXIN) 500 MG tablet Take 1-2 tablets (500-1,000 mg total) by mouth every 8 (eight) hours as needed for muscle spasms (Headache or migraine). 40 tablet 0   metoprolol succinate (TOPROL-XL) 25 MG 24 hr tablet Take 1 tablet (25 mg total) by mouth at bedtime. 30 tablet 1   SUMAtriptan (IMITREX) 50 MG tablet Take 1 tablet (50 mg total) by mouth as directed. May repeat in 2 hours if headache persists or recurs, max 24 hour dose = 4 pills. 20 tablet 1   No facility-administered medications prior to visit.   Past Medical History:  Diagnosis Date   Annual physical exam 04/11/2021   Concussion    Ovarian cyst    Persistent cough for 3 weeks or longer 03/22/2021    No past surgical history on file. No Known Allergies    Objective:    Physical Exam Vitals and nursing note reviewed.  Constitutional:      Appearance: Normal appearance.  HENT:     Right Ear: Tympanic membrane normal. No decreased hearing noted. Swelling (ear canal) and tenderness present. No drainage. There is no impacted cerumen.     Left Ear: Tympanic membrane normal. No decreased hearing noted. Tenderness present. No drainage. There is no impacted cerumen.  Cardiovascular:     Rate and Rhythm: Normal rate and regular rhythm.  Pulmonary:     Effort: Pulmonary effort is normal.     Breath sounds: Normal breath sounds.  Musculoskeletal:        General: Normal range of motion.  Skin:    General: Skin is warm and dry.  Neurological:     Mental Status: She is alert.  Psychiatric:        Mood and Affect: Mood normal.        Behavior: Behavior normal.    BP 121/68 (BP Location: Left Arm, Patient Position: Sitting, Cuff Size: Large)   Pulse 65   Temp 98.5 F (36.9 C) (Temporal)   Ht 5\' 5"  (1.651 m)   Wt 181 lb 6.4 oz (82.3 kg)   LMP 11/20/2021 (Exact Date)   SpO2 97%  BMI 30.19 kg/m  Wt Readings from Last 3 Encounters:  12/04/21 181 lb 6.4 oz (82.3 kg)  11/11/21 187 lb 2 oz (84.9 kg)  10/04/21 184 lb 2 oz (83.5 kg)       Dulce Sellar, NP

## 2021-12-30 ENCOUNTER — Encounter: Payer: Self-pay | Admitting: *Deleted

## 2022-03-20 ENCOUNTER — Encounter: Payer: Self-pay | Admitting: *Deleted

## 2022-05-07 ENCOUNTER — Encounter: Payer: Self-pay | Admitting: Family

## 2022-05-07 ENCOUNTER — Ambulatory Visit (INDEPENDENT_AMBULATORY_CARE_PROVIDER_SITE_OTHER): Payer: Managed Care, Other (non HMO) | Admitting: Family

## 2022-05-07 VITALS — BP 128/78 | HR 77 | Temp 97.8°F | Wt 171.2 lb

## 2022-05-07 DIAGNOSIS — R12 Heartburn: Secondary | ICD-10-CM | POA: Diagnosis not present

## 2022-05-07 DIAGNOSIS — Z23 Encounter for immunization: Secondary | ICD-10-CM | POA: Diagnosis not present

## 2022-05-07 MED ORDER — FAMOTIDINE 20 MG PO TABS: 20.0000 mg | ORAL_TABLET | Freq: Every day | ORAL | 5 refills | Status: DC

## 2022-05-07 NOTE — Progress Notes (Signed)
Patient ID: Susan Barry, female    DOB: Oct 01, 1995, 27 y.o.   MRN: [id number]  Chief Complaint  Patient presents with   Heartburn    Heart burn and belching x Thursday. Tried tx with tums with no relief.    HPI:      Heartburn & belching:   reports sx starting about a week ago with a lot of belching and heartburn. She has used Tums with only mild relief and sometimes does not help at all. Denies any dysphagia, nausea, or epigastric pain, no sore throat.      Assessment & Plan:  1. Heartburn - w/belching. Sending generic Pepcid, advised on use & SE. Advised on low acid & low gassy food diet, better food choices, foods to avoid, including caffeine, drinking from straws or chewing gum.  - famotidine (PEPCID) 20 MG tablet; Take 1 tablet (20 mg total) by mouth daily. Take for 1-2 weeks then ok to stop or continue based on symptoms.  Dispense: 30 tablet; Refill: 5  2. Need for influenza vaccination -  - Flu Vaccine QUAD 6+ mos PF IM (Fluarix Quad PF)  Subjective:    Outpatient Medications Prior to Visit  Medication Sig Dispense Refill   ESTARYLLA 0.25-35 MG-MCG tablet Take 1 tablet by mouth daily.     hydrOXYzine (ATARAX) 10 MG tablet Take 1 tablet (10 mg total) by mouth 3 (three) times daily as needed. (Patient not taking: Reported on 05/07/2022) 30 tablet 0   methocarbamol (ROBAXIN) 500 MG tablet Take 1-2 tablets (500-1,000 mg total) by mouth every 8 (eight) hours as needed for muscle spasms (Headache or migraine). (Patient not taking: Reported on 05/07/2022) 40 tablet 0   metoprolol succinate (TOPROL-XL) 25 MG 24 hr tablet Take 1 tablet (25 mg total) by mouth at bedtime. (Patient not taking: Reported on 05/07/2022) 30 tablet 1   SUMAtriptan (IMITREX) 50 MG tablet Take 1 tablet (50 mg total) by mouth as directed. May repeat in 2 hours if headache persists or recurs, max 24 hour dose = 4 pills. (Patient not taking: Reported on 05/07/2022) 20 tablet 1   No facility-administered  medications prior to visit.   Past Medical History:  Diagnosis Date   Annual physical exam 04/11/2021   Concussion    Ovarian cyst    Persistent cough for 3 weeks or longer 03/22/2021   No past surgical history on file. No Known Allergies    Objective:    Physical Exam Vitals and nursing note reviewed.  Constitutional:      Appearance: Normal appearance.  Cardiovascular:     Rate and Rhythm: Normal rate and regular rhythm.  Pulmonary:     Effort: Pulmonary effort is normal.     Breath sounds: Normal breath sounds.  Musculoskeletal:        General: Normal range of motion.  Skin:    General: Skin is warm and dry.  Neurological:     Mental Status: She is alert.  Psychiatric:        Mood and Affect: Mood normal.        Behavior: Behavior normal.   BP 128/78 (BP Location: Right Arm, Patient Position: Sitting, Cuff Size: Large)   Pulse 77   Temp 97.8 F (36.6 C) (Temporal)   Wt 171 lb 3.2 oz (77.7 kg)   LMP 05/07/2022   SpO2 99%   BMI 28.49 kg/m  Wt Readings from Last 3 Encounters:  05/07/22 171 lb 3.2 oz (77.7 kg)  12/04/21 181 lb 6.4  oz (82.3 kg)  11/11/21 187 lb 2 oz (84.9 kg)       Jeanie Sewer, NP

## 2022-08-07 ENCOUNTER — Ambulatory Visit: Payer: Managed Care, Other (non HMO) | Admitting: Family

## 2022-08-07 ENCOUNTER — Other Ambulatory Visit (HOSPITAL_COMMUNITY)
Admission: RE | Admit: 2022-08-07 | Discharge: 2022-08-07 | Disposition: A | Discharge status: Home / Self Care | Payer: Managed Care, Other (non HMO) | Source: Ambulatory Visit | Attending: Family | Admitting: Family

## 2022-08-07 ENCOUNTER — Encounter: Payer: Self-pay | Admitting: Family

## 2022-08-07 ENCOUNTER — Telehealth: Payer: Self-pay | Admitting: Family

## 2022-08-07 VITALS — BP 123/80 | HR 64 | Temp 97.5°F | Ht 65.0 in | Wt 162.1 lb

## 2022-08-07 DIAGNOSIS — R102 Pelvic and perineal pain: Secondary | ICD-10-CM | POA: Diagnosis present

## 2022-08-07 DIAGNOSIS — R110 Nausea: Secondary | ICD-10-CM | POA: Diagnosis not present

## 2022-08-07 LAB — POCT URINALYSIS DIPSTICK
Bilirubin, UA: NEGATIVE
Blood, UA: POSITIVE — AB
Glucose, UA: NEGATIVE
Ketones, UA: NEGATIVE
Leukocytes, UA: NEGATIVE
Nitrite, UA: NEGATIVE
Protein, UA: NEGATIVE
Spec Grav, UA: 1.02 (ref 1.010–1.025)
Urobilinogen, UA: 0.2 E.U./dL
pH, UA: 6 (ref 5.0–8.0)

## 2022-08-07 LAB — POCT URINE PREGNANCY: Preg Test, Ur: NEGATIVE

## 2022-08-07 MED ORDER — ONDANSETRON HCL 4 MG PO TABS: 4.0000 mg | ORAL_TABLET | Freq: Four times a day (QID) | ORAL | 0 refills | Status: AC | PRN

## 2022-08-07 NOTE — Telephone Encounter (Signed)
Final outcome: See HCP within 4 hours   Patient Name: [facility] A Skidmore Gender: Female DOB: Jan 19, 1996 Age: 27 Y 6 M 22 D Return Phone Number: [phone] (Primary) Address: City/ State/ Zip: [place]  [address] Client [facility] - Administrator, [facility] at [facility] Contact Type Call Who Is Calling Patient / Member / Family / Caregiver Call Type Triage / Clinical Relationship To Patient Self Return Phone Number [phone] (Primary) Chief Complaint Abdominal Pain Reason for Call Symptomatic / Request for Health Information Initial Comment Caller states she has lower abdominal discomfort and back pain that feels like a burning sensation as well as nausea. Abdominal pain not severe, feels like period cramps. Dr. Andree Coss not listed confirmed location. Translation No Nurse Assessment Nurse: Mayford Knife, RN, Lupe Carney Date/Time (Eastern Time): 08/07/2022 8:56:31 AM Confirm and document reason for call. If symptomatic, describe symptoms. ---Abdominal cramping, nausea since last night. Does the patient have any new or worsening symptoms? ---Yes Will a triage be completed? ---Yes Related visit to physician within the last 2 weeks? ---No Does the PT have any chronic conditions? (i.e. diabetes, asthma, this includes High risk factors for pregnancy, etc.) ---No Is the patient pregnant or possibly pregnant? (Ask all females between the ages of 38-55) ---No Is this a behavioral health or substance abuse call? ---No Guidelines Guideline Title Affirmed Question Affirmed Notes Nurse Date/Time (Eastern Time) Abdominal Pain - Female [1] MILDMODERATE pain AND [2] constant AND [3] present > 2 hours Turner, RN, Rebekah 08/07/2022 8:57:17 AM PLEASE NOTE: All timestamps contained within this report are represented as [place] Standard Time. CONFIDENTIALTY NOTICE: This fax transmission is intended only for the addressee. It  contains information that is legally privileged, confidential or otherwise protected from use or disclosure. If you are not the intended recipient, you are strictly prohibited from reviewing, disclosing, copying using or disseminating any of this information or taking any action in reliance on or regarding this information. If you have received this fax in error, please notify us immediately by telephone so that we can arrange for its return to [place]. Phone: [phone], Toll-Free: [phone], Fax: [phone] Page: 2 of 2 Call Id: [id number] Disp. Time Lamount Cohen Time) Disposition Final User 08/07/2022 9:01:41 AM See HCP within 4 Hours (or PCP triage) Yes Mayford Knife, RN, Lupe Carney Final Disposition 08/07/2022 9:01:41 AM See HCP within 4 Hours (or PCP triage) Yes Mayford Knife, RN, Lupe Carney Caller Disagree/Comply Comply Caller Understands Yes PreDisposition Call Doctor Care Advice Given Per Guideline SEE HCP (OR PCP TRIAGE) WITHIN 4 HOURS: * IF OFFICE WILL BE OPEN: You need to be seen within the next 3 or 4 hours. Call your doctor (or NP/PA) now or as soon as the office opens. REST: * Lie down. * Rest until you are seen. NOTHING BY MOUTH: * Do not eat or drink anything for now. CALL BACK IF: * You become worse CARE ADVICE given per Abdominal Pain - Female (Adult) guideline. Referrals REFERRED TO PCP OFFICE

## 2022-08-07 NOTE — Telephone Encounter (Signed)
Lurena Joiner, access nurse, states final outcome is for patient to be seen sooner since she already had an OV for 5/2 @ 2:40pm. Pt has been moved to 10:20 am on 5/2 w/ PCP.

## 2022-08-07 NOTE — Progress Notes (Signed)
Patient ID: Susan Barry, female    DOB: 07-Jan-1996, 27 y.o.   MRN: [id number]  Chief Complaint  Patient presents with   Abdominal Pain    Pt abdominal pain, nausea and burning sensation in lower right sided. Present since last night, Has tried ginger ale and water which did not help.     HPI:      Pelvic pain:  reports starting last evening with lower pelvic pain, nausea and also right lumbar burning. She drank some gingerale but has not helped. Reports she had a normal menstrual cycle last week. She has had ovarian cysts in past but this pain does not feel the same. Denies any urinary sx and no vaginal discharge or itching.  Has not had a UTI in long time.  Assessment & Plan:  1. Pelvic pain - rapid testing neg. -mild blood noted,  sending urine out for further testing. Advised pt to seek ER if back pain or pelvic pain worsens to check for kidney stone. Sometimes it is possible to pass tiny stones without ER tx, using medications, if pain is not severe and can call the office.  - Urine cytology ancillary only - POCT Urinalysis Dipstick - POCT urine pregnancy  2. Nausea - zofran sent to pharmacy, advised on use & SE, call back if sx are not improving.  - ondansetron (ZOFRAN) 4 MG tablet; Take 1 tablet (4 mg total) by mouth every 6 (six) hours as needed for nausea or vomiting.  Dispense: 20 tablet; Refill: 0   Subjective:    Outpatient Medications Prior to Visit  Medication Sig Dispense Refill   ESTARYLLA 0.25-35 MG-MCG tablet Take 1 tablet by mouth daily.     famotidine (PEPCID) 20 MG tablet Take 1 tablet (20 mg total) by mouth daily. Take for 1-2 weeks then ok to stop or continue based on symptoms. (Patient not taking: Reported on 08/07/2022) 30 tablet 5   hydrOXYzine (ATARAX) 10 MG tablet Take 1 tablet (10 mg total) by mouth 3 (three) times daily as needed. (Patient not taking: Reported on 08/07/2022) 30 tablet 0   methocarbamol (ROBAXIN) 500 MG tablet Take 1-2 tablets  (500-1,000 mg total) by mouth every 8 (eight) hours as needed for muscle spasms (Headache or migraine). (Patient not taking: Reported on 08/07/2022) 40 tablet 0   metoprolol succinate (TOPROL-XL) 25 MG 24 hr tablet Take 1 tablet (25 mg total) by mouth at bedtime. (Patient not taking: Reported on 08/07/2022) 30 tablet 1   SUMAtriptan (IMITREX) 50 MG tablet Take 1 tablet (50 mg total) by mouth as directed. May repeat in 2 hours if headache persists or recurs, max 24 hour dose = 4 pills. (Patient not taking: Reported on 08/07/2022) 20 tablet 1   No facility-administered medications prior to visit.   Past Medical History:  Diagnosis Date   Annual physical exam 04/11/2021   Concussion    Ovarian cyst    Persistent cough for 3 weeks or longer 03/22/2021   No past surgical history on file. No Known Allergies    Objective:    Physical Exam Vitals and nursing note reviewed.  Constitutional:      Appearance: Normal appearance.  Cardiovascular:     Rate and Rhythm: Normal rate and regular rhythm.  Pulmonary:     Effort: Pulmonary effort is normal.     Breath sounds: Normal breath sounds.  Abdominal:     General: There is no distension.     Palpations: There is no hepatomegaly or mass.  Tenderness: There is no abdominal tenderness. There is no right CVA tenderness or left CVA tenderness.  Musculoskeletal:        General: Normal range of motion.  Skin:    General: Skin is warm and dry.  Neurological:     Mental Status: She is alert.  Psychiatric:        Mood and Affect: Mood normal.        Behavior: Behavior normal.    BP 123/80 (BP Location: Left Arm, Patient Position: Sitting, Cuff Size: Normal)   Pulse 64   Temp (!) 97.5 F (36.4 C) (Temporal)   Ht 5\' 5"  (1.651 m)   Wt 162 lb 2 oz (73.5 kg)   SpO2 99%   BMI 26.98 kg/m  Wt Readings from Last 3 Encounters:  08/07/22 162 lb 2 oz (73.5 kg)  05/07/22 171 lb 3.2 oz (77.7 kg)  12/04/21 181 lb 6.4 oz (82.3 kg)       Dulce Sellar, NP

## 2022-08-07 NOTE — Telephone Encounter (Signed)
Patient made OV due to lower abdominal discomfort/lower back pain. I called to ensure symptoms didn't need triage.   FYI: This call has been transferred to Access Nurse. Once the result note has been entered staff can address the message at that time.  Patient called in with the following symptoms:  Red Word:abdominal pain, lower back pain towards right (burning), and nausea--Onset last night   Please advise at Mobile [phone] (mobile)  Message is routed to Provider Pool and [facility] Triage

## 2022-08-08 LAB — URINE CYTOLOGY ANCILLARY ONLY
Bacterial Vaginitis-Urine: NEGATIVE
Candida Urine: NEGATIVE

## 2023-06-29 IMAGING — CT CT ANGIO CHEST
2 of 6 series · 18 of 36 positions shown · IV contrast (omnipaque)
Comparison: None.

CLINICAL DATA: Tachycardia

EXAM:
CT ANGIOGRAPHY CHEST WITH CONTRAST
TECHNIQUE: Multidetector CT imaging of the chest was performed using the
standard protocol during bolus administration of intravenous
contrast. Multiplanar CT image reconstructions and MIPs were
obtained to evaluate the vascular anatomy.
CONTRAST:  80mL OMNIPAQUE IOHEXOL 350 MG/ML SOLN

[Series 5: thins · axial · 0.67mm/px · z∈[-1,+215]mm · 17 of 244 slices shown]
[im 14/244  lung]
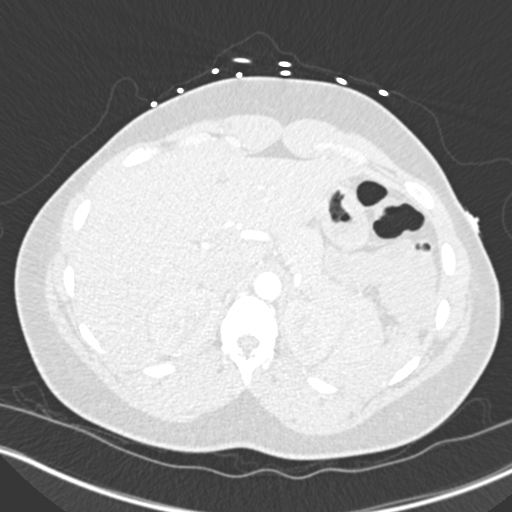
[im 28/244  mediastinal]
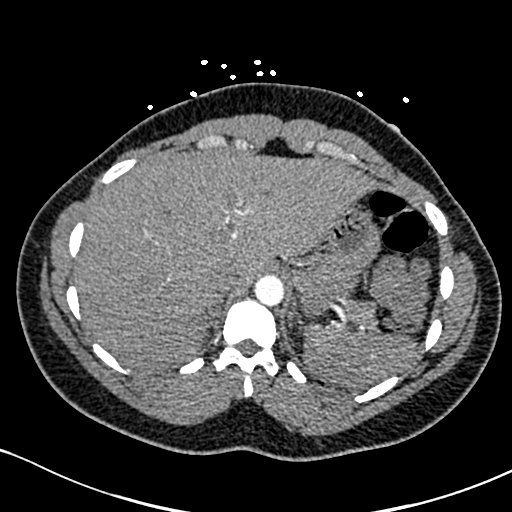
[im 41/244  lung]
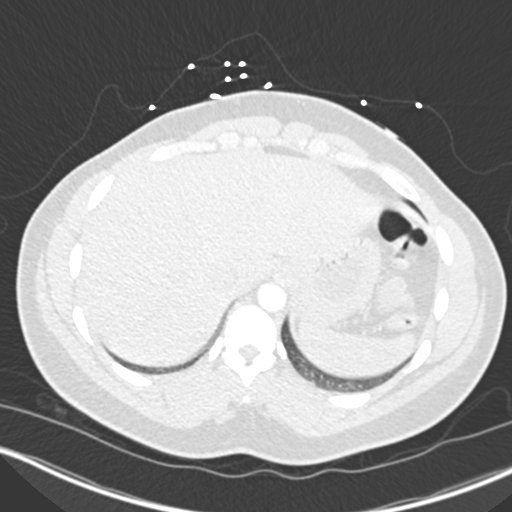
[im 55/244  mediastinal]
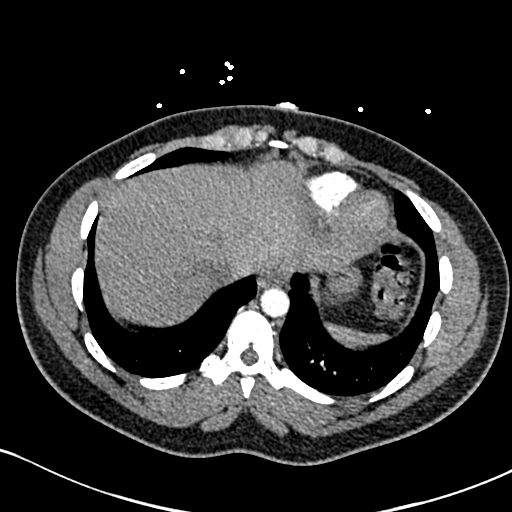
[im 68/244  lung]
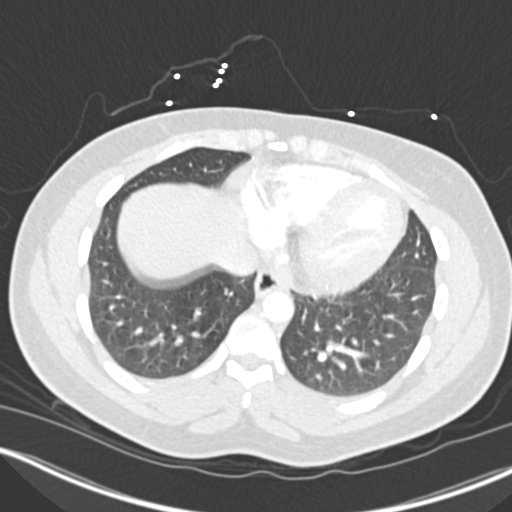
[im 82/244  mediastinal]
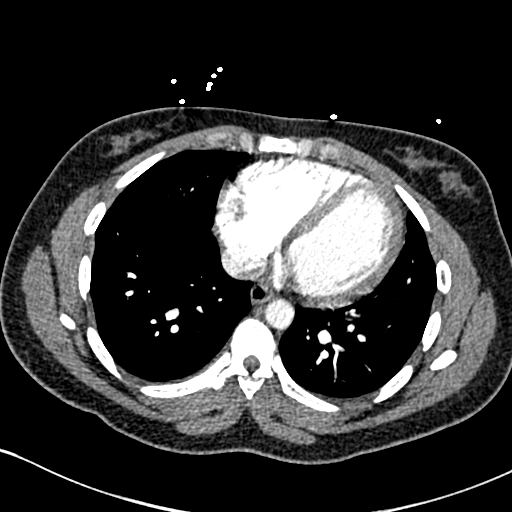
[im 95/244  lung]
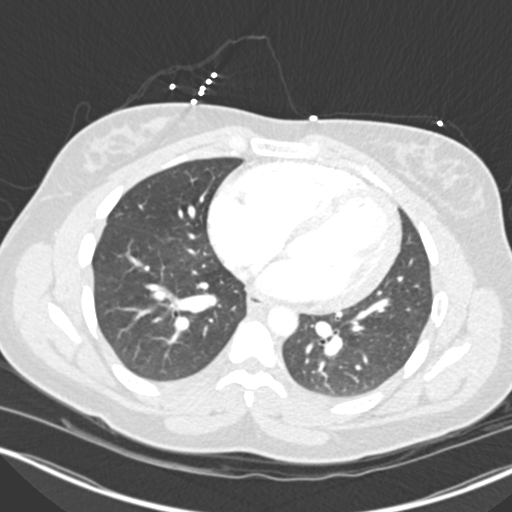
[im 109/244  mediastinal]
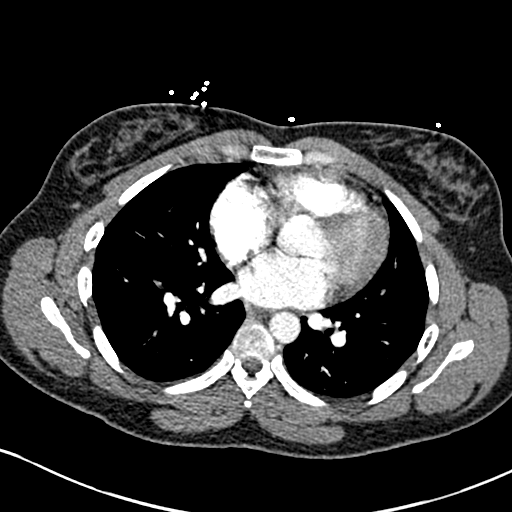
[im 122/244  lung]
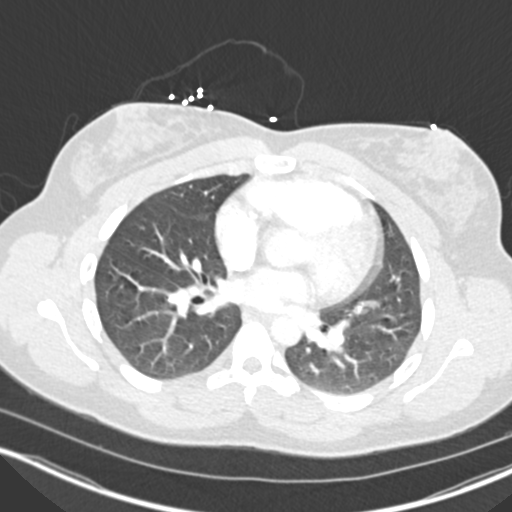
[im 136/244  mediastinal]
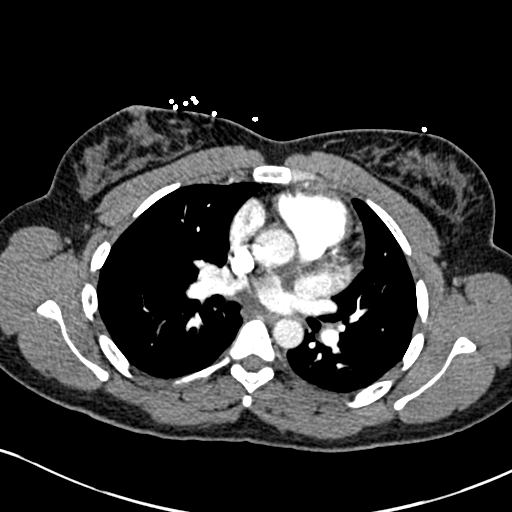
[im 149/244  lung]
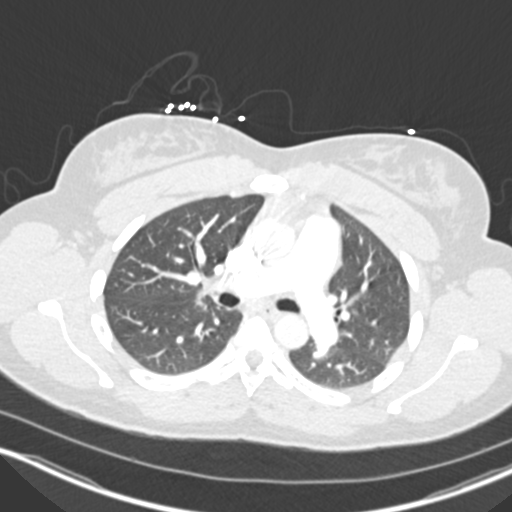
[im 163/244  mediastinal]
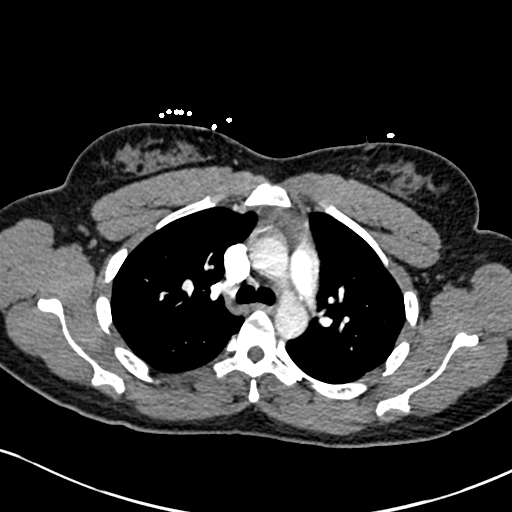
[im 176/244  lung]
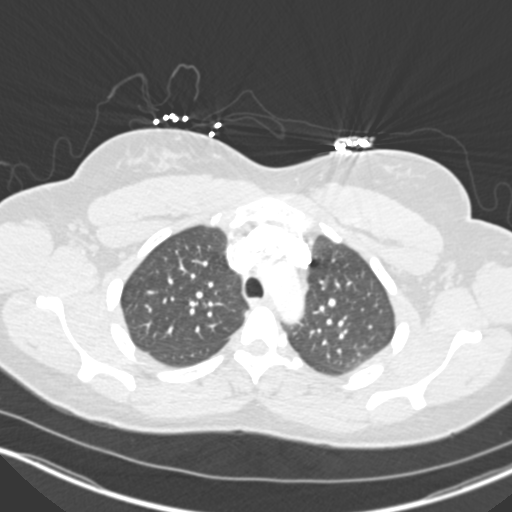
[im 190/244  mediastinal]
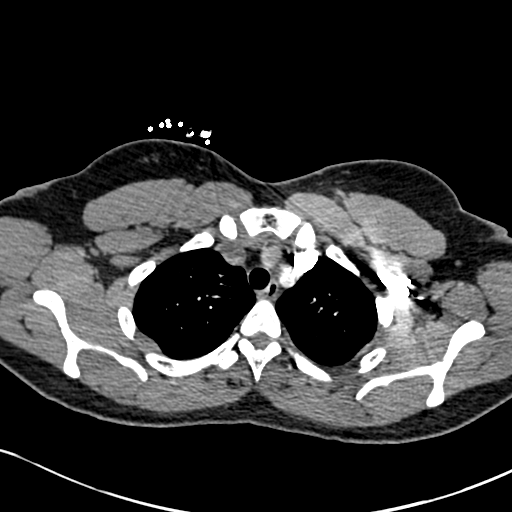
[im 203/244  lung]
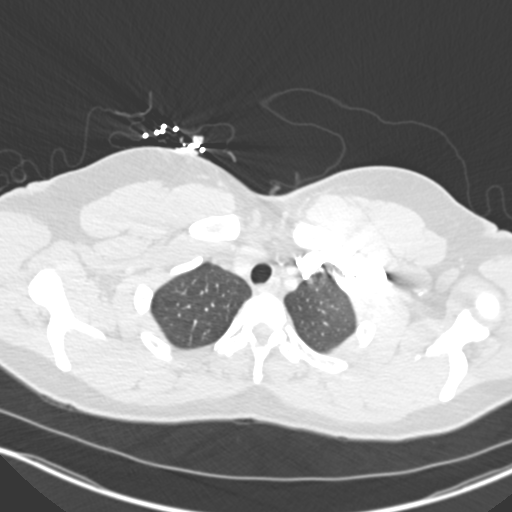
[im 217/244  mediastinal]
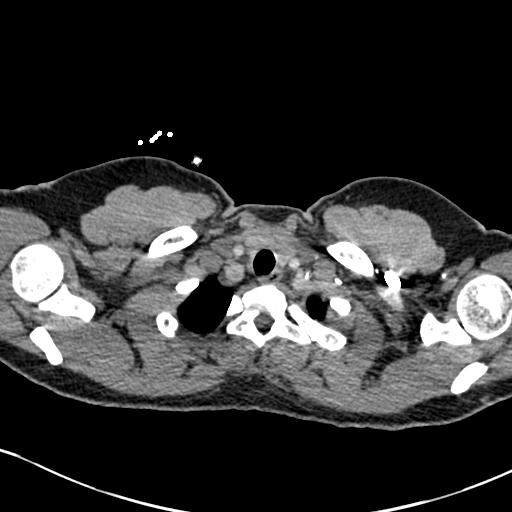
[im 230/244  lung]
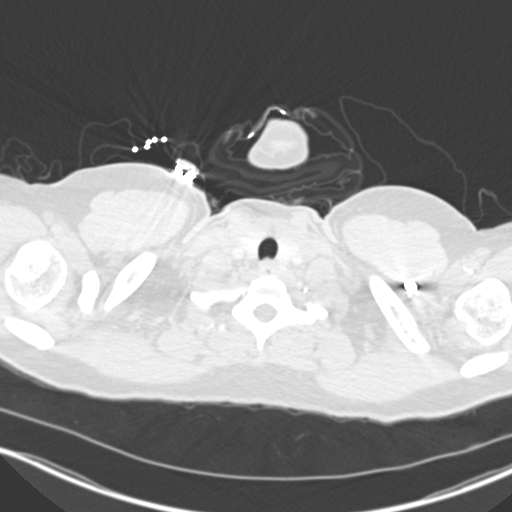

[Series 6: coronal mpr · coronal · 0.52mm/px · 1 of 120 slices shown]
[im 60/120  mediastinal]
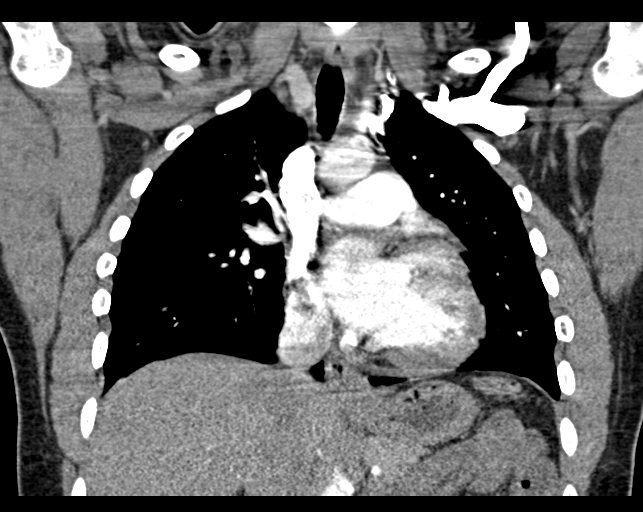

[18 of 36 positions shown; findings below may reference images not displayed]

FINDINGS: Cardiovascular: Satisfactory opacification of the bilateral
pulmonary arteries to the lobar level. Evaluation is mildly
constrained by respiratory motion. No evidence of pulmonary
embolism.

Although not tailored for evaluation of the thoracic aorta, there is
no evidence of thoracic aortic aneurysm or dissection.

The heart is normal in size.  No pericardial effusion.

Mediastinum/Nodes: No suspicious mediastinal lymphadenopathy.

Visualized thyroid is unremarkable.

Lungs/Pleura: Lungs are essentially clear. No suspicious pulmonary
nodules, noting motion degradation.

No focal consolidation.

No pleural effusion or pneumothorax.

Upper Abdomen: Visualized upper abdomen is unremarkable.

Musculoskeletal: Visualized osseous structures are within normal
limits.

Review of the MIP images confirms the above findings.
IMPRESSION: No evidence of pulmonary embolism.

Negative CT chest.

## 2023-07-06 ENCOUNTER — Ambulatory Visit: Admitting: Family

## 2023-07-06 ENCOUNTER — Encounter: Payer: Self-pay | Admitting: Family

## 2023-07-06 VITALS — BP 109/57 | HR 52 | Temp 97.7°F | Ht 65.0 in | Wt 150.2 lb

## 2023-07-06 DIAGNOSIS — R1013 Epigastric pain: Secondary | ICD-10-CM | POA: Diagnosis not present

## 2023-07-06 DIAGNOSIS — R1084 Generalized abdominal pain: Secondary | ICD-10-CM

## 2023-07-06 MED ORDER — OMEPRAZOLE 20 MG PO CPDR: 20.0000 mg | DELAYED_RELEASE_CAPSULE | ORAL | 2 refills | Status: AC

## 2023-07-06 NOTE — Progress Notes (Signed)
 Patient ID: Susan Barry, female    DOB: 1995/07/07, 28 y.o.   MRN: [id number]  Chief Complaint  Patient presents with   Abdominal Pain    Pt c/o lower abdominal pain and back pain, burning sensation. SX present since Saturday.   Discussed the use of AI scribe software for clinical note transcription with the patient, who gave verbal consent to proceed.  History of Present Illness The patient presents with abdominal pain, described as a burning sensation, which she has been experiencing since the end of her menstrual period. The pain is located in the lower abdomen on both sides and radiates to the back. She also reports experiencing nausea the previous night. The patient denies any changes in diet or introduction of new foods. She also reports persistent back pain, described as a needle-like sensation, which is not associated with gas. The patient has daily, regular bowel movements and has lost some weight. She denies any history of acid reflux or indigestion.  Assessment & Plan Abdominal Pain - Reports abdominal pain with a burning sensation, nausea, and back pain. Pain is not consistent with menstrual cramps and is described as feeling like a needle sticking. Differential diagnosis includes increased stomach acid, possibly due to dietary factors, caffeine, or stress, gallbladder, flatulence. No prior acid problems or indigestion. A trial of omeprazole is recommended to manage symptoms and assess response.  - Prescribing omeprazole 20mg  twice daily for one week, then reduce to once daily - Advise use of GoodRx card if insurance does not cover the medication - Instruct to monitor symptoms and dietary intake for potential acid triggers or stress. - Advise to contact if symptoms persist for further testing  General Health Maintenance - She is considering waiting until the fall for the flu shot to ensure coverage during peak flu season. - Plan to administer flu shot in the fall, around  October or November      Subjective:    Outpatient Medications Prior to Visit  Medication Sig Dispense Refill   ESTARYLLA 0.25-35 MG-MCG tablet Take 1 tablet by mouth daily.     ondansetron (ZOFRAN) 4 MG tablet Take 1 tablet (4 mg total) by mouth every 6 (six) hours as needed for nausea or vomiting. (Patient not taking: Reported on 07/06/2023) 20 tablet 0   metoprolol succinate (TOPROL-XL) 25 MG 24 hr tablet Take 1 tablet (25 mg total) by mouth at bedtime. (Patient not taking: Reported on 07/06/2023) 30 tablet 1   SUMAtriptan (IMITREX) 50 MG tablet Take 1 tablet (50 mg total) by mouth as directed. May repeat in 2 hours if headache persists or recurs, max 24 hour dose = 4 pills. (Patient not taking: Reported on 07/06/2023) 20 tablet 1   No facility-administered medications prior to visit.   Past Medical History:  Diagnosis Date   Annual physical exam 04/11/2021   Concussion    Ovarian cyst    Persistent cough for 3 weeks or longer 03/22/2021   No past surgical history on file. No Known Allergies    Objective:    Physical Exam Vitals and nursing note reviewed.  Constitutional:      Appearance: Normal appearance.  Cardiovascular:     Rate and Rhythm: Normal rate and regular rhythm.  Pulmonary:     Effort: Pulmonary effort is normal.     Breath sounds: Normal breath sounds.  Abdominal:     Tenderness: There is generalized abdominal tenderness. There is no guarding. Negative signs include Murphy's sign and McBurney's sign.  Hernia: No hernia is present.  Musculoskeletal:        General: Normal range of motion.  Skin:    General: Skin is warm and dry.  Neurological:     Mental Status: She is alert.  Psychiatric:        Mood and Affect: Mood normal.        Behavior: Behavior normal.    BP (!) 109/57 (BP Location: Left Arm, Patient Position: Sitting)   Pulse (!) 52   Temp 97.7 F (36.5 C) (Temporal)   Ht 5\' 5"  (1.651 m)   Wt 150 lb 3.2 oz (68.1 kg)   LMP 07/01/2023 (Exact  Date)   SpO2 100%   BMI 24.99 kg/m  Wt Readings from Last 3 Encounters:  07/06/23 150 lb 3.2 oz (68.1 kg)  08/07/22 162 lb 2 oz (73.5 kg)  05/07/22 171 lb 3.2 oz (77.7 kg)      Dulce Sellar, NP

## 2023-07-13 ENCOUNTER — Ambulatory Visit: Admitting: Family

## 2023-07-13 ENCOUNTER — Encounter: Payer: Self-pay | Admitting: Family

## 2023-07-13 VITALS — BP 120/84 | HR 64 | Temp 98.3°F | Ht 65.0 in | Wt 154.6 lb

## 2023-07-13 DIAGNOSIS — R1084 Generalized abdominal pain: Secondary | ICD-10-CM

## 2023-07-13 DIAGNOSIS — R1013 Epigastric pain: Secondary | ICD-10-CM | POA: Diagnosis not present

## 2023-07-13 NOTE — Progress Notes (Signed)
 Patient ID: Susan Barry, female    DOB: 06-29-1995, 29 y.o.   MRN: [id number]  Chief Complaint  Patient presents with   Abdominal Pain    Pt states was seen last week but is worse now using bathroom more as well.Back pain is very back now. She does not even want to do any excises or anything its so painful to do.  Discussed the use of AI scribe software for clinical note transcription with the patient, who gave verbal consent to proceed.  History of Present Illness The patient presents with worsening lower abdominal pain, primarily on the left side, but also on the right. The pain is described as a burning sensation that radiates to the back. The discomfort has been so severe that it has limited her ability to exercise, a daily routine. She has been managing the pain with a heating pad and ibuprofen, but relief has been minimal. She has also been taking Prilosec, which has not provided significant relief. The patient has noticed an increase in bowel movements, which are typically normal but occasionally softer. She is unsure if this change is due to her medication. She has been taking Prilosec twice a day, but is due to reduce to once a day. The patient also mentions a history of constipation, but does not believe this is the cause of her current discomfort. She has been drinking plenty of water and her bowel movements have been regular and soft. She has not experienced any gas that could be causing discomfort. She still has her appendix and gallbladder, but does not believe these are the source of her pain.  Assessment & Plan Abdominal Pain - Persistent bilateral lower abdominal pain radiating to the back, unresponsive to ibuprofen and omeprazole. Differential includes peptic ulcer disease, pancreatic or gallbladder pathology. Appendicitis unlikely. - Refer to GI specialist for evaluation, consider ultrasound or endoscopy. - Continue omeprazole once daily post twice daily regimen. - Drink  2L water daily, add fiber as needed to ensure daily soft BM. - Consider OTC magnesium supplementation qd for bowel regulation. - Report if no contact from GI office by week's end.  Increased Bowel Movements - Increased bowel movement frequency, softer but mostly normal, seen as positive change from previous constipation.   Subjective:    Outpatient Medications Prior to Visit  Medication Sig Dispense Refill   ESTARYLLA 0.25-35 MG-MCG tablet Take 1 tablet by mouth daily.     omeprazole (PRILOSEC) 20 MG capsule Take 1 capsule (20 mg total) by mouth as directed. Take twice a day for 1 week, then reduce to daily for 1 week and then qod or prn for sx relief. 45 capsule 2   ondansetron (ZOFRAN) 4 MG tablet Take 1 tablet (4 mg total) by mouth every 6 (six) hours as needed for nausea or vomiting. 20 tablet 0   No facility-administered medications prior to visit.   Past Medical History:  Diagnosis Date   Annual physical exam 04/11/2021   Concussion    Ovarian cyst    Persistent cough for 3 weeks or longer 03/22/2021   History reviewed. No pertinent surgical history. No Known Allergies    Objective:    Physical Exam Vitals and nursing note reviewed.  Constitutional:      Appearance: Normal appearance.  Cardiovascular:     Rate and Rhythm: Normal rate and regular rhythm.  Pulmonary:     Effort: Pulmonary effort is normal.     Breath sounds: Normal breath sounds.  Abdominal:  Tenderness: There is abdominal tenderness in the right lower quadrant and left lower quadrant. There is no guarding. Negative signs include Murphy's sign and McBurney's sign.     Hernia: No hernia is present.  Musculoskeletal:        General: Normal range of motion.  Skin:    General: Skin is warm and dry.  Neurological:     Mental Status: She is alert.  Psychiatric:        Mood and Affect: Mood normal.        Behavior: Behavior normal.    BP 120/84   Pulse 64   Temp 98.3 F (36.8 C) (Temporal)   Ht  5\' 5"  (1.651 m)   Wt 154 lb 9.6 oz (70.1 kg)   LMP 07/01/2023 (Exact Date)   SpO2 98%   BMI 25.73 kg/m  Wt Readings from Last 3 Encounters:  07/13/23 154 lb 9.6 oz (70.1 kg)  07/06/23 150 lb 3.2 oz (68.1 kg)  08/07/22 162 lb 2 oz (73.5 kg)      Dulce Sellar, NP

## 2023-07-14 ENCOUNTER — Encounter: Payer: Self-pay | Admitting: Gastroenterology

## 2023-07-20 ENCOUNTER — Other Ambulatory Visit (INDEPENDENT_AMBULATORY_CARE_PROVIDER_SITE_OTHER)

## 2023-07-20 ENCOUNTER — Ambulatory Visit
Admission: RE | Admit: 2023-07-20 | Discharge: 2023-07-20 | Disposition: A | Discharge status: Home / Self Care | Source: Ambulatory Visit | Attending: Gastroenterology

## 2023-07-20 ENCOUNTER — Ambulatory Visit: Admitting: Gastroenterology

## 2023-07-20 ENCOUNTER — Encounter: Payer: Self-pay | Admitting: Gastroenterology

## 2023-07-20 VITALS — BP 110/84 | HR 72 | Ht 65.0 in | Wt 154.4 lb

## 2023-07-20 DIAGNOSIS — K582 Mixed irritable bowel syndrome: Secondary | ICD-10-CM

## 2023-07-20 DIAGNOSIS — R142 Eructation: Secondary | ICD-10-CM

## 2023-07-20 DIAGNOSIS — R194 Change in bowel habit: Secondary | ICD-10-CM | POA: Diagnosis not present

## 2023-07-20 DIAGNOSIS — K219 Gastro-esophageal reflux disease without esophagitis: Secondary | ICD-10-CM

## 2023-07-20 DIAGNOSIS — R109 Unspecified abdominal pain: Secondary | ICD-10-CM | POA: Diagnosis not present

## 2023-07-20 LAB — COMPREHENSIVE METABOLIC PANEL WITH GFR
ALT: 9 U/L (ref 0–35)
AST: 15 U/L (ref 0–37)
Albumin: 4.1 g/dL (ref 3.5–5.2)
Alkaline Phosphatase: 48 U/L (ref 39–117)
BUN: 18 mg/dL (ref 6–23)
CO2: 27 meq/L (ref 19–32)
Calcium: 9.1 mg/dL (ref 8.4–10.5)
Chloride: 103 meq/L (ref 96–112)
Creatinine, Ser: 0.89 mg/dL (ref 0.40–1.20)
GFR: 88.17 mL/min (ref >60.00)
Glucose, Bld: 75 mg/dL (ref 70–99)
Potassium: 4 meq/L (ref 3.5–5.1)
Sodium: 137 meq/L (ref 135–145)
Total Bilirubin: 0.3 mg/dL (ref 0.2–1.2)
Total Protein: 7.1 g/dL (ref 6.0–8.3)

## 2023-07-20 LAB — CBC WITH DIFFERENTIAL/PLATELET
Basophils Absolute: 0 10*3/uL (ref 0.0–0.1)
Basophils Relative: 0.3 % (ref 0.0–3.0)
Eosinophils Absolute: 0.1 10*3/uL (ref 0.0–0.7)
Eosinophils Relative: 1.2 % (ref 0.0–5.0)
HCT: 38.5 % (ref 36.0–46.0)
Hemoglobin: 13 g/dL (ref 12.0–15.0)
Lymphocytes Relative: 26.7 % (ref 12.0–46.0)
Lymphs Abs: 2.3 10*3/uL (ref 0.7–4.0)
MCHC: 33.7 g/dL (ref 30.0–36.0)
MCV: 91.4 fl (ref 78.0–100.0)
Monocytes Absolute: 1.1 10*3/uL — ABNORMAL HIGH (ref 0.1–1.0)
Monocytes Relative: 13.1 % — ABNORMAL HIGH (ref 3.0–12.0)
Neutro Abs: 5.1 10*3/uL (ref 1.4–7.7)
Neutrophils Relative %: 58.7 % (ref 43.0–77.0)
Platelets: 276 10*3/uL (ref 150.0–400.0)
RBC: 4.22 Mil/uL (ref 3.87–5.11)
RDW: 12.9 % (ref 11.5–15.5)
WBC: 8.7 10*3/uL (ref 4.0–10.5)

## 2023-07-20 LAB — TSH: TSH: 0.79 u[IU]/mL (ref 0.35–5.50)

## 2023-07-20 NOTE — Progress Notes (Signed)
 Chief Complaint: Abdominal pain Primary GI MD: Gentry Fitz  HPI: Discussed the use of AI scribe software for clinical note transcription with the patient, who gave verbal consent to proceed.  History of Present Illness Susan Barry is a 28 year old female who presents with abdominal pain and discomfort.  She has been experiencing abdominal pain and discomfort for the past two weeks, primarily on the left side but also in other areas of the abdomen. Initially, she thought it might be related to ovarian cysts, but the pain is now more generalized in the upper abdomen on the left side and even can radiate to the right side. She was prescribed omeprazole, which has not improved her symptoms. She notes associated back pain as well.  She reports a decrease in appetite and an increase in bowel movements, which range from solid to very loose stools. The stool color varies from green to dark brown, but there is no blood present. She has not taken any over-the-counter medications like Pepto Bismol, only the prescribed omeprazole.  She experiences associated back pain with her abdominal pain, which significantly impacts her daily activities, including her regular exercise routine.  She recalls a history of similar symptoms, specifically in May 2024, when she was seen for pelvic pain.  January 2024 and in 2023, indicating a pattern of gastrointestinal issues occurring in the spring. However, the current increase in bowel movements is a new symptom.  She has been using MiraLAX regularly in the past to manage her bowel movements and finds it effective. No imaging studies have been done for her current symptoms.    Past Medical History:  Diagnosis Date   Annual physical exam 04/11/2021   Concussion    Ovarian cyst    Persistent cough for 3 weeks or longer 03/22/2021    History reviewed. No pertinent surgical history.  Current Outpatient Medications  Medication Sig Dispense Refill   ESTARYLLA  0.25-35 MG-MCG tablet Take 1 tablet by mouth daily.     omeprazole (PRILOSEC) 20 MG capsule Take 1 capsule (20 mg total) by mouth as directed. Take twice a day for 1 week, then reduce to daily for 1 week and then qod or prn for sx relief. 45 capsule 2   ondansetron (ZOFRAN) 4 MG tablet Take 1 tablet (4 mg total) by mouth every 6 (six) hours as needed for nausea or vomiting. (Patient not taking: Reported on 07/20/2023) 20 tablet 0   No current facility-administered medications for this visit.    Allergies as of 07/20/2023   (No Known Allergies)    History reviewed. No pertinent family history.  Social History   Socioeconomic History   Marital status: Single    Spouse name: Not on file   Number of children: Not on file   Years of education: Not on file   Highest education level: Bachelor's degree (e.g., BA, AB, BS)  Occupational History   Not on file  Tobacco Use   Smoking status: Never   Smokeless tobacco: Never  Vaping Use   Vaping status: Never Used  Substance and Sexual Activity   Alcohol use: Yes    Comment: occ   Drug use: Never   Sexual activity: Yes    Birth control/protection: Pill  Other Topics Concern   Not on file  Social History Narrative   Not on file   Social Drivers of Health   Financial Resource Strain: Low Risk  (07/05/2023)   Overall Financial Resource Strain (CARDIA)    Difficulty of  Paying Living Expenses: Not hard at all  Food Insecurity: No Food Insecurity (07/05/2023)   Hunger Vital Sign    Worried About Running Out of Food in the Last Year: Never true    Ran Out of Food in the Last Year: Never true  Transportation Needs: No Transportation Needs (07/05/2023)   PRAPARE - Administrator, Civil Service (Medical): No    Lack of Transportation (Non-Medical): No  Physical Activity: Sufficiently Active (07/05/2023)   Exercise Vital Sign    Days of Exercise per Week: 6 days    Minutes of Exercise per Session: 60 min  Stress: No Stress  Concern Present (07/05/2023)   [employer] of Occupational Health - Occupational Stress Questionnaire    Feeling of Stress : Not at all  Social Connections: Moderately Isolated (07/05/2023)   Social Connection and Isolation Panel [NHANES]    Frequency of Communication with Friends and Family: More than three times a week    Frequency of Social Gatherings with Friends and Family: Three times a week    Attends Religious Services: Never    Active Member of Clubs or Organizations: No    Attends Banker Meetings: Not on file    Marital Status: Living with partner  Intimate Partner Violence: Unknown (07/11/2021)   Received from [employer], [facility]   HITS    Physically Hurt: Not on file    Insult or Talk Down To: Not on file    Threaten Physical Harm: Not on file    Scream or Curse: Not on file    Review of Systems:    Constitutional: No weight loss, fever, chills, weakness or fatigue HEENT: Eyes: No change in vision               Ears, Nose, Throat:  No change in hearing or congestion Skin: No rash or itching Cardiovascular: No chest pain, chest pressure or palpitations   Respiratory: No SOB or cough Gastrointestinal: See HPI and otherwise negative Genitourinary: No dysuria or change in urinary frequency Neurological: No headache, dizziness or syncope Musculoskeletal: No new muscle or joint pain Hematologic: No bleeding or bruising Psychiatric: No history of depression or anxiety    Physical Exam:  Vital signs: BP 110/84   Pulse 72   Ht 5\' 5"  (1.651 m)   Wt 154 lb 6 oz (70 kg)   LMP 07/01/2023 (Exact Date)   BMI 25.69 kg/m   Constitutional: NAD, alert and cooperative Head:  Normocephalic and atraumatic. Eyes:   PEERL, EOMI. No icterus. Conjunctiva pink. Respiratory: Respirations even and unlabored. Lungs clear to auscultation bilaterally.   No wheezes, crackles, or rhonchi.  Cardiovascular:  Regular rate and rhythm. No peripheral edema, cyanosis or  pallor.  Gastrointestinal:  Soft, nondistended, nontender. No rebound or guarding. hypoactive bowel sounds. No appreciable masses or hepatomegaly. Rectal:  Declines Msk:  Symmetrical without gross deformities. Without edema, no deformity or joint abnormality.  Neurologic:  Alert and  oriented x4;  grossly normal neurologically.  Skin:   Dry and intact without significant lesions or rashes. Psychiatric: Oriented to person, place and time. Demonstrates good judgement and reason without abnormal affect or behaviors.  RELEVANT LABS AND IMAGING: CBC    Component Value Date/Time   WBC 9.7 04/11/2021 0829   RBC 3.92 04/11/2021 0829   HGB 11.7 (L) 04/11/2021 0829   HCT 34.9 (L) 04/11/2021 0829   PLT 308.0 04/11/2021 0829   MCV 89.0 04/11/2021 0829   MCH 30.1 12/06/2020  1827   MCHC 33.7 04/11/2021 0829   RDW 14.0 04/11/2021 0829   LYMPHSABS 1.9 04/11/2021 0829   MONOABS 0.9 04/11/2021 0829   EOSABS 0.0 04/11/2021 0829   BASOSABS 0.0 04/11/2021 0829    CMP     Component Value Date/Time   NA 137 04/11/2021 0829   NA 139 05/26/2017 1458   K 4.1 04/11/2021 0829   CL 105 04/11/2021 0829   CO2 25 04/11/2021 0829   GLUCOSE 91 04/11/2021 0829   BUN 9 04/11/2021 0829   BUN 13 05/26/2017 1458   CREATININE 0.89 04/11/2021 0829   CALCIUM 9.1 04/11/2021 0829   PROT 7.0 04/11/2021 0829   ALBUMIN 4.0 04/11/2021 0829   AST 18 04/11/2021 0829   ALT 14 04/11/2021 0829   ALKPHOS 45 04/11/2021 0829   BILITOT 0.4 04/11/2021 0829   GFRNONAA >60 12/06/2020 1827   GFRAA 111 05/26/2017 1458     Assessment/Plan:   Left-sided abdominal pain Alternating bowel habits Minimal relief with heating pad/ibuprofen.  Minimal relief with Prilosec.  Lower abdominal pain worse on the left side that radiates to her back. Suspect IBS mixed type versus primary constipation.  No previous diagnostic imaging or labs.  Also pattern noted over the last few years with similar symptoms every spring at improve on their  own - CBC, CMP, TSH - KUB - Recommend fiber supplement (Benefiber 1 to 2 tablespoons/day) - If no improvement in fiber supplement could add in MiraLAX - If all of the above is negative and no improvement could consider further diagnostics with stool studies versus CT scan - Patient to MyChart message if no improvement in a few weeks - Follow-up 8 weeks  Eructation GERD Longstanding history of GERD with worsening eructation recently.  Symptoms per above indicate more likely lower GI etiology.  No improvement on antacid. - Educated patient on lifestyle modifications - H. pylori stool antigen  Assigned to Dr. Laurent Pontes, PA-C [facility] 07/20/2023, 2:23 PM  Cc: Versa Gore, NP

## 2023-07-20 NOTE — Progress Notes (Signed)
 Agree with assessment and plan as outlined.

## 2023-07-20 NOTE — Patient Instructions (Addendum)
 A high fiber diet with plenty of fluids (up to 8 glasses of water daily) is suggested to relieve these symptoms. benefiber, 1 tablespoon once or twice daily can be used to keep bowels regular if needed.   Your provider has requested that you go to the basement level for lab work before leaving today. Press "B" on the elevator. The lab is located at the first door on the left as you exit the elevator.  Your provider has requested that you have an abdominal x ray before leaving today. Please go to the basement floor to our Radiology department for the test.  Due to recent changes in healthcare laws, you may see the results of your imaging and laboratory studies on MyChart before your provider has had a chance to review them.  We understand that in some cases there may be results that are confusing or concerning to you. Not all laboratory results come back in the same time frame and the provider may be waiting for multiple results in order to interpret others.  Please give us  48 hours in order for your provider to thoroughly review all the results before contacting the office for clarification of your results.   _______________________________________________________  If your blood pressure at your visit was 140/90 or greater, please contact your primary care physician to follow up on this.  _______________________________________________________  If you are age 62 or older, your body mass index should be between 23-30. Your Body mass index is 25.69 kg/m. If this is out of the aforementioned range listed, please consider follow up with your Primary Care Provider.  If you are age 59 or younger, your body mass index should be between 19-25. Your Body mass index is 25.69 kg/m. If this is out of the aformentioned range listed, please consider follow up with your Primary Care Provider.   ________________________________________________________  The [facility] providers would like to encourage you to  use MYCHART to communicate with providers for non-urgent requests or questions.  Due to long hold times on the telephone, sending your provider a message by [facility] may be a faster and more efficient way to get a response.  Please allow 48 business hours for a response.  Please remember that this is for non-urgent requests.  _______________________________________________________ Thank you for trusting me with your gastrointestinal care!   Suzanna Erp, PA

## 2023-08-04 ENCOUNTER — Encounter: Payer: Self-pay | Admitting: *Deleted

## 2024-02-12 ENCOUNTER — Encounter: Payer: Self-pay | Admitting: Family

## 2024-02-12 ENCOUNTER — Ambulatory Visit: Admitting: Family

## 2024-02-12 VITALS — BP 122/75 | HR 57 | Temp 97.6°F | Ht 65.0 in | Wt 163.6 lb

## 2024-02-12 DIAGNOSIS — G4452 New daily persistent headache (NDPH): Secondary | ICD-10-CM | POA: Diagnosis not present

## 2024-02-12 DIAGNOSIS — Z1159 Encounter for screening for other viral diseases: Secondary | ICD-10-CM

## 2024-02-12 DIAGNOSIS — Z114 Encounter for screening for human immunodeficiency virus [HIV]: Secondary | ICD-10-CM

## 2024-02-12 DIAGNOSIS — Z0000 Encounter for general adult medical examination without abnormal findings: Secondary | ICD-10-CM

## 2024-02-12 DIAGNOSIS — Z0001 Encounter for general adult medical examination with abnormal findings: Secondary | ICD-10-CM | POA: Diagnosis not present

## 2024-02-12 MED ORDER — KETOROLAC TROMETHAMINE 60 MG/2ML IM SOLN
60.0000 mg | Freq: Once | INTRAMUSCULAR | Status: AC
  Administered 2024-02-12: 60 mg via INTRAMUSCULAR

## 2024-02-12 NOTE — Patient Instructions (Addendum)
 It was very nice to see you today!   I will review your lab results via MyChart in a few days.  You look great! Stay well! Keep exercising!     PLEASE NOTE:  If you had any lab tests please let us  know if you have not heard back within a few days. You may see your results on MyChart before we have a chance to review them but we will give you a call once they are reviewed by us . If we ordered any referrals today, please let us  know if you have not heard from their office within the next week.
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
## 2024-02-13 LAB — COMPREHENSIVE METABOLIC PANEL WITH GFR
AG Ratio: 1.4 (calc) (ref 1.0–2.5)
ALT: 12 U/L (ref 6–29)
AST: 18 U/L (ref 10–30)
Albumin: 4.2 g/dL (ref 3.6–5.1)
Alkaline phosphatase (APISO): 48 U/L (ref 31–125)
BUN: 18 mg/dL (ref 7–25)
CO2: 30 mmol/L (ref 20–32)
Calcium: 9.7 mg/dL (ref 8.6–10.2)
Chloride: 101 mmol/L (ref 98–110)
Creat: 0.78 mg/dL (ref 0.50–0.96)
Globulin: 2.9 g/dL (ref 1.9–3.7)
Glucose, Bld: 81 mg/dL (ref 65–99)
Potassium: 4.2 mmol/L (ref 3.5–5.3)
Sodium: 136 mmol/L (ref 135–146)
Total Bilirubin: 0.3 mg/dL (ref 0.2–1.2)
Total Protein: 7.1 g/dL (ref 6.1–8.1)
eGFR: 105 mL/min/1.73m2 (ref >=60)

## 2024-02-13 LAB — CBC WITH DIFFERENTIAL/PLATELET
Absolute Lymphocytes: 2660 {cells}/uL (ref 850–3900)
Absolute Monocytes: 653 {cells}/uL (ref 200–950)
Basophils Absolute: 18 {cells}/uL (ref 0–200)
Basophils Relative: 0.3 %
Eosinophils Absolute: 49 {cells}/uL (ref 15–500)
Eosinophils Relative: 0.8 %
HCT: 37.6 % (ref 35.0–45.0)
Hemoglobin: 12.2 g/dL (ref 11.7–15.5)
MCH: 29.8 pg (ref 27.0–33.0)
MCHC: 32.4 g/dL (ref 32.0–36.0)
MCV: 91.9 fL (ref 80.0–100.0)
MPV: 9.7 fL (ref 7.5–12.5)
Monocytes Relative: 10.7 %
Neutro Abs: 2721 {cells}/uL (ref 1500–7800)
Neutrophils Relative %: 44.6 %
Platelets: 309 Thousand/uL (ref 140–400)
RBC: 4.09 Million/uL (ref 3.80–5.10)
RDW: 12.1 % (ref 11.0–15.0)
Total Lymphocyte: 43.6 %
WBC: 6.1 Thousand/uL (ref 3.8–10.8)

## 2024-02-13 LAB — LIPID PANEL
Cholesterol: 185 mg/dL (ref <200)
HDL: 69 mg/dL (ref >=50)
LDL Cholesterol (Calc): 99 mg/dL
Non-HDL Cholesterol (Calc): 116 mg/dL (ref <130)
Total CHOL/HDL Ratio: 2.7 (calc) (ref <5.0)
Triglycerides: 83 mg/dL (ref <150)

## 2024-02-13 LAB — HIV ANTIBODY (ROUTINE TESTING W REFLEX)
HIV 1&2 Ab, 4th Generation: NONREACTIVE
HIV FINAL INTERPRETATION: NEGATIVE

## 2024-02-13 LAB — TSH: TSH: 1.47 m[IU]/L

## 2024-02-13 LAB — HEPATITIS C ANTIBODY: Hepatitis C Ab: NONREACTIVE

## 2024-02-15 ENCOUNTER — Ambulatory Visit: Payer: Self-pay | Admitting: Family

## 2024-02-29 NOTE — Progress Notes (Signed)
 Ben Rosaleah Person D.[facility] [address] [place] [address] Phone: [phone]  Assessment and Plan:     1. Concussion without loss of consciousness, initial encounter (Primary) -Subacute, improving, complicated, initial sports medicine visit - Consistent with new concussion sustained on 01/27/2024 when patient was elbowed in the head playing volleyball.  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  If flare does not resolve, patient to be excused from work.  Work note provided - Recommend low intensity physical activity as tolerated     Date of injury was 01/27/2024.  Original symptom severity scores were 12 and 43.   Recommendations:  -  Goal of sleeping a minimum of 7-8 continuous hours nightly. May use up to melatonin 5 mg nightly. - Recommend light physical activity for 15-30 minutes a day while keeping symptoms less than 3/10 - Stop mental or physical activities that cause symptoms to worsen greater than 3/10, and wait 24 hours before attempting them again - Eliminate screen time as much as possible for first 48 hours after concussive event, then continue limited screen time (recommend less than 2 hours per day)  Pertinent previous records reviewed include none  - Encouraged to RTC in 4 weeks for reassessment or sooner for any concerns or acute changes    I spent 33 minutes during day of visit on patient care, which included discussing concussion pathology course, encounter documentation, chart review, physical exam, treatment plan, symptom severity score, VOMS, and tandem gait testing being performed,  interpreted, and discussed with patient at today's visit.   Subjective:   I, Susan Barry, am serving as a neurosurgeon for Doctor Morene Mace  Chief Complaint: concussion symptoms   HPI:   03/09/2024 Patient is a 28 year old female with concussion symptoms. Patient states she was playing volleyball 01/27/2024 she was elbowed in the face. Symptoms started a couple of days later. She was seen by PCP. States she is still having symptoms    Concussion HPI:  - Injury date: 01/27/2024   - Mechanism of injury: elbow to the head  - LOC: no  - Initial evaluation: PCP  - Previous head injuries/concussions: yes    - Previous imaging: no    - Social history: work and she also teaches pre K  Hospitalization for head injury? No Diagnosed/treated for headache disorder, migraines, or seizures? Migraines  Diagnosed with learning disability karlyn? No Diagnosed with ADD/ADHD? No Diagnose with Depression, anxiety, or other Psychiatric Disorder? No   Current medications:  Current Outpatient Medications  Medication Sig Dispense Refill   ESTARYLLA 0.25-35 MG-MCG tablet Take 1 tablet by mouth daily.     omeprazole  (PRILOSEC) 20 MG capsule Take 1 capsule (20 mg total) by mouth as directed. Take twice a day for 1 week, then reduce to daily for 1 week and then qod or prn for sx relief. 45 capsule 2   ondansetron  (ZOFRAN ) 4 MG tablet Take 1 tablet (4 mg total) by mouth every 6 (six) hours as needed for nausea or vomiting. (Patient not taking: Reported on 07/20/2023) 20 tablet 0   No current facility-administered medications for this visit.      Objective:  Vitals:   03/09/24 1512  Pulse: 82  SpO2: 98%  Weight: 169 lb (76.7 kg)  Height: 5' 5 (1.651 m)      Body mass index is 28.12 kg/m.    Physical Exam:     General: Well-appearing, cooperative, sitting comfortably in no acute distress.  Psychiatric: Mood and affect are appropriate.   Neuro:sensation intact and strength 5/5 with  no deficits, no atrophy, normal muscle tone   Today's Symptom Severity Score:  Scores: 0-6  Headache:2 Pressure in head:6  Neck Pain:5 Nausea or vomiting:0 Dizziness:0 Blurred vision:0 Balance problems:0 Sensitivity to light:4 Sensitivity to noise:3 Feeling slowed down:4 Feeling like "in a fog":2 "Don't feel right":4 Difficulty concentrating:2 Difficulty remembering:0  Fatigue or low energy:4 Confusion:0  Drowsiness:0  More emotional:3 Irritability:4 Sadness:0  Nervous or Anxious:0 Trouble falling or staying asleep:0  Total number of symptoms: 12/22  Symptom Severity index: 43/132  Worse with physical activity? Yes  Worse with mental activity? Yes  Percent improved since injury: 60%    Full pain-free cervical PROM: yes     Cognitive:  - Months backwards: 0 Mistakes. 9 seconds  mVOMS:   - Baseline symptoms: 0 - Horizontal Vestibular-Ocular Reflex: 0/10  - Smooth pursuits: 0/10  - Horizontal Saccades:  0/10  - Visual Motion Sensitivity Test:  0/10  - Convergence: 4,4cm (<5 cm normal)    Autonomic:  - Symptomatic with supine to standing: No   Complex Tandem Gait: - Forward, eyes open: 0 errors - Backward, eyes open: 0 errors - Forward, eyes closed: 0- errors - Backward, eyes closed: 0 errors  Electronically signed by:  Odis Mace D.[facility] 3:33 PM 03/09/24

## 2024-03-09 ENCOUNTER — Ambulatory Visit: Admitting: Sports Medicine

## 2024-03-09 VITALS — HR 82 | Ht 65.0 in | Wt 169.0 lb

## 2024-03-09 DIAGNOSIS — S060X0A Concussion without loss of consciousness, initial encounter: Secondary | ICD-10-CM

## 2024-03-09 NOTE — Patient Instructions (Signed)
 Recommend using blue light glasses if you have to be on screens  Recommend carrying sunglasses and hat for light irritation   Work note provided should be allowed 10-15 min break when symptomatic. If symptoms do not resolve she should go home  Light physical activity as tolerated no HIIT workouts  4 week follow up

## 2024-04-11 NOTE — Progress Notes (Signed)
 "  [facility] [address] [place] [address] Phone: [phone]  Assessment and Plan:     1. Concussion without loss of consciousness, subsequent encounter (Primary) -Subacute, improving, subsequent visit - Consistent with significantly improved concussion sustained on 01/27/2024 from elbow to the head while playing volleyball - I believe patient has sufficiently recovered.  Cleared to restart all mental and physical activities without restrictions.  Advance activity as tolerated   Date of injury was 01/27/2024.  Symptom severity scores of 3 and 5 today.  Original symptom severity scores were 12 and 43.   Recommendations:  -  Goal of sleeping a minimum of 7-8 continuous hours nightly. May use up to melatonin 5 mg nightly. - Recommend light physical activity for 15-30 minutes a day while keeping symptoms less than 3/10 - Stop mental or physical activities that cause symptoms to worsen greater than 3/10, and wait 24 hours before attempting them again - Eliminate screen time as much as possible for first 48 hours after concussive event, then continue limited screen time (recommend less than 2 hours per day)  Pertinent previous records reviewed include none  - Encouraged to RTC as needed   I spent 31 minutes during day of visit on patient care, which included discussing concussion pathology course, encounter documentation, chart review, physical exam, treatment plan, symptom severity score, VOMS, and tandem gait testing being performed, interpreted, and discussed with patient at today's visit.   Subjective:   I, Susan Barry, am serving as a neurosurgeon for Susan Barry   Chief Complaint: concussion symptoms    HPI:    03/09/2024 Patient is a 29 year old female with concussion symptoms. Patient states she was playing volleyball 01/27/2024 she was elbowed in the face. Symptoms started a couple of days later. She was seen by PCP.  States she is still having symptoms   04/12/2024 Patient states she is a lot better    Concussion HPI:  - Injury date: 01/27/2024   - Mechanism of injury: elbow to the head  - LOC: no  - Initial evaluation: PCP  - Previous head injuries/concussions: yes    - Previous imaging: no    - Social history: work and she also teaches pre [employer]   Hospitalization for head injury? No Diagnosed/treated for headache disorder, migraines, or seizures? Migraines  Diagnosed with learning disability karlyn? No Diagnosed with ADD/ADHD? No Diagnose with Depression, anxiety, or other Psychiatric Disorder? No     Current medications:  Current Outpatient Medications  Medication Sig Dispense Refill   ESTARYLLA 0.25-35 MG-MCG tablet Take 1 tablet by mouth daily.     omeprazole  (PRILOSEC) 20 MG capsule Take 1 capsule (20 mg total) by mouth as directed. Take twice a day for 1 week, then reduce to daily for 1 week and then qod or prn for sx relief. 45 capsule 2   ondansetron  (ZOFRAN ) 4 MG tablet Take 1 tablet (4 mg total) by mouth every 6 (six) hours as needed for nausea or vomiting. (Patient not taking: Reported on 07/20/2023) 20 tablet 0   No current facility-administered medications for this visit.      Objective:     Vitals:   04/12/24 1539  Pulse: 69  SpO2: 95%  Weight: 169 lb (76.7 kg)  Height: 5' 5 (1.651 m)      Body mass index is 28.12 kg/m.    Physical Exam:     General: Well-appearing, cooperative, sitting comfortably in no acute  distress.  Psychiatric: Mood and affect are appropriate.   Neuro:sensation intact and strength 5/5 with no deficits, no atrophy, normal muscle tone   Today's Symptom Severity Score:  Scores: 0-6  Headache:3 Pressure in head:0  Neck Pain:0 Nausea or vomiting:0 Dizziness:0 Blurred vision:0 Balance problems:0 Sensitivity to light:0 Sensitivity to noise:0 Feeling slowed down:1 Feeling like in a fog:0 Dont feel right:1 Difficulty  concentrating:0 Difficulty remembering:0  Fatigue or low energy:0 Confusion:0  Drowsiness:0  More emotional:0 Irritability:0 Sadness:0  Nervous or Anxious:0 Trouble falling or staying asleep:0  Total number of symptoms: 3/22  Symptom Severity index: 5/132  Worse with physical activity? No Worse with mental activity? No Percent improved since injury: 98%    Full pain-free cervical PROM: yes     Cognitive:  - Months backwards: 0 Mistakes. 9 seconds  mVOMS:   - Baseline symptoms: 0 - Horizontal Vestibular-Ocular Reflex: 0/10  - Smooth pursuits: 0/10  - Horizontal Saccades:  0/10  - Visual Motion Sensitivity Test:  0/10  - Convergence: 4,4cm (<5 cm normal)    Autonomic:  - Symptomatic with supine to standing: No   Complex Tandem Gait: - Forward, eyes open: 0 errors - Backward, eyes open: 0 errors - Forward, eyes closed: 0 errors - Backward, eyes closed: 1 errors  Electronically signed by:  [facility] 3:49 PM 04/12/2024 "

## 2024-04-12 ENCOUNTER — Ambulatory Visit (INDEPENDENT_AMBULATORY_CARE_PROVIDER_SITE_OTHER): Admitting: Sports Medicine

## 2024-04-12 VITALS — HR 69 | Ht 65.0 in | Wt 169.0 lb

## 2024-04-12 DIAGNOSIS — S060X0D Concussion without loss of consciousness, subsequent encounter: Secondary | ICD-10-CM | POA: Diagnosis not present

## 2024-04-12 NOTE — Patient Instructions (Signed)
 She is cleared to return to all physical and mental activity as tolerated   As needed follow up
